# Patient Record
Sex: Female | Born: 1940 | Race: White | Hispanic: No | Smoking: Former smoker
Health system: Southern US, Community
[De-identification: ages and names within clinical notes are randomized; demographics above are authoritative.]

## PROBLEM LIST (undated history)

## (undated) HISTORY — PX: BREAST EXCISIONAL BIOPSY: SUR124

## (undated) HISTORY — PX: ABDOMINAL HYSTERECTOMY: SHX81

---

## 2013-12-03 DIAGNOSIS — L723 Sebaceous cyst: Secondary | ICD-10-CM | POA: Diagnosis not present

## 2013-12-03 DIAGNOSIS — L821 Other seborrheic keratosis: Secondary | ICD-10-CM | POA: Diagnosis not present

## 2013-12-03 DIAGNOSIS — L570 Actinic keratosis: Secondary | ICD-10-CM | POA: Diagnosis not present

## 2013-12-18 DIAGNOSIS — M9981 Other biomechanical lesions of cervical region: Secondary | ICD-10-CM | POA: Diagnosis not present

## 2013-12-18 DIAGNOSIS — M545 Low back pain, unspecified: Secondary | ICD-10-CM | POA: Diagnosis not present

## 2013-12-18 DIAGNOSIS — M999 Biomechanical lesion, unspecified: Secondary | ICD-10-CM | POA: Diagnosis not present

## 2013-12-25 DIAGNOSIS — N951 Menopausal and female climacteric states: Secondary | ICD-10-CM | POA: Diagnosis not present

## 2013-12-25 DIAGNOSIS — N952 Postmenopausal atrophic vaginitis: Secondary | ICD-10-CM | POA: Diagnosis not present

## 2014-01-15 DIAGNOSIS — I10 Essential (primary) hypertension: Secondary | ICD-10-CM | POA: Diagnosis not present

## 2014-01-15 DIAGNOSIS — Z23 Encounter for immunization: Secondary | ICD-10-CM | POA: Diagnosis not present

## 2014-01-15 DIAGNOSIS — M1990 Unspecified osteoarthritis, unspecified site: Secondary | ICD-10-CM | POA: Diagnosis not present

## 2014-01-15 DIAGNOSIS — N952 Postmenopausal atrophic vaginitis: Secondary | ICD-10-CM | POA: Diagnosis not present

## 2014-01-15 DIAGNOSIS — E785 Hyperlipidemia, unspecified: Secondary | ICD-10-CM | POA: Diagnosis not present

## 2014-01-15 DIAGNOSIS — G4700 Insomnia, unspecified: Secondary | ICD-10-CM | POA: Diagnosis not present

## 2014-01-15 DIAGNOSIS — E119 Type 2 diabetes mellitus without complications: Secondary | ICD-10-CM | POA: Diagnosis not present

## 2014-01-15 DIAGNOSIS — IMO0002 Reserved for concepts with insufficient information to code with codable children: Secondary | ICD-10-CM | POA: Diagnosis not present

## 2014-01-23 DIAGNOSIS — M2550 Pain in unspecified joint: Secondary | ICD-10-CM | POA: Diagnosis not present

## 2014-02-02 DIAGNOSIS — J309 Allergic rhinitis, unspecified: Secondary | ICD-10-CM | POA: Diagnosis not present

## 2014-02-02 DIAGNOSIS — R21 Rash and other nonspecific skin eruption: Secondary | ICD-10-CM | POA: Diagnosis not present

## 2014-02-03 DIAGNOSIS — M25473 Effusion, unspecified ankle: Secondary | ICD-10-CM | POA: Diagnosis not present

## 2014-02-03 DIAGNOSIS — M659 Synovitis and tenosynovitis, unspecified: Secondary | ICD-10-CM | POA: Diagnosis not present

## 2014-02-03 DIAGNOSIS — M12579 Traumatic arthropathy, unspecified ankle and foot: Secondary | ICD-10-CM | POA: Diagnosis not present

## 2014-02-03 DIAGNOSIS — M25476 Effusion, unspecified foot: Secondary | ICD-10-CM | POA: Diagnosis not present

## 2014-02-03 DIAGNOSIS — M779 Enthesopathy, unspecified: Secondary | ICD-10-CM | POA: Diagnosis not present

## 2014-02-05 DIAGNOSIS — R21 Rash and other nonspecific skin eruption: Secondary | ICD-10-CM | POA: Diagnosis not present

## 2014-02-12 DIAGNOSIS — H269 Unspecified cataract: Secondary | ICD-10-CM | POA: Diagnosis not present

## 2014-02-12 DIAGNOSIS — E119 Type 2 diabetes mellitus without complications: Secondary | ICD-10-CM | POA: Diagnosis not present

## 2014-03-19 DIAGNOSIS — Z23 Encounter for immunization: Secondary | ICD-10-CM | POA: Diagnosis not present

## 2014-05-09 DIAGNOSIS — L255 Unspecified contact dermatitis due to plants, except food: Secondary | ICD-10-CM | POA: Diagnosis not present

## 2014-05-09 DIAGNOSIS — T622X1A Toxic effect of other ingested (parts of) plant(s), accidental (unintentional), initial encounter: Secondary | ICD-10-CM | POA: Diagnosis not present

## 2014-05-14 DIAGNOSIS — Z85828 Personal history of other malignant neoplasm of skin: Secondary | ICD-10-CM | POA: Diagnosis not present

## 2014-05-14 DIAGNOSIS — L259 Unspecified contact dermatitis, unspecified cause: Secondary | ICD-10-CM | POA: Diagnosis not present

## 2014-05-21 DIAGNOSIS — I10 Essential (primary) hypertension: Secondary | ICD-10-CM | POA: Diagnosis not present

## 2014-05-21 DIAGNOSIS — M2550 Pain in unspecified joint: Secondary | ICD-10-CM | POA: Diagnosis not present

## 2014-05-21 DIAGNOSIS — M1990 Unspecified osteoarthritis, unspecified site: Secondary | ICD-10-CM | POA: Diagnosis not present

## 2014-05-21 DIAGNOSIS — Z0000 Encounter for general adult medical examination without abnormal findings: Secondary | ICD-10-CM | POA: Diagnosis not present

## 2014-05-21 DIAGNOSIS — E785 Hyperlipidemia, unspecified: Secondary | ICD-10-CM | POA: Diagnosis not present

## 2014-05-21 DIAGNOSIS — E119 Type 2 diabetes mellitus without complications: Secondary | ICD-10-CM | POA: Diagnosis not present

## 2014-05-21 DIAGNOSIS — N952 Postmenopausal atrophic vaginitis: Secondary | ICD-10-CM | POA: Diagnosis not present

## 2014-05-28 DIAGNOSIS — M25579 Pain in unspecified ankle and joints of unspecified foot: Secondary | ICD-10-CM | POA: Diagnosis not present

## 2014-06-23 DIAGNOSIS — R7309 Other abnormal glucose: Secondary | ICD-10-CM | POA: Diagnosis not present

## 2014-06-23 DIAGNOSIS — Z23 Encounter for immunization: Secondary | ICD-10-CM | POA: Diagnosis not present

## 2014-06-23 DIAGNOSIS — R5383 Other fatigue: Secondary | ICD-10-CM | POA: Diagnosis not present

## 2014-06-23 DIAGNOSIS — Z79899 Other long term (current) drug therapy: Secondary | ICD-10-CM | POA: Diagnosis not present

## 2014-06-23 DIAGNOSIS — R635 Abnormal weight gain: Secondary | ICD-10-CM | POA: Diagnosis not present

## 2014-06-23 DIAGNOSIS — Z131 Encounter for screening for diabetes mellitus: Secondary | ICD-10-CM | POA: Diagnosis not present

## 2014-06-23 DIAGNOSIS — I10 Essential (primary) hypertension: Secondary | ICD-10-CM | POA: Diagnosis not present

## 2014-06-23 DIAGNOSIS — E782 Mixed hyperlipidemia: Secondary | ICD-10-CM | POA: Diagnosis not present

## 2014-06-23 DIAGNOSIS — R5381 Other malaise: Secondary | ICD-10-CM | POA: Diagnosis not present

## 2014-08-03 DIAGNOSIS — Z1231 Encounter for screening mammogram for malignant neoplasm of breast: Secondary | ICD-10-CM | POA: Diagnosis not present

## 2014-08-05 DIAGNOSIS — D2272 Melanocytic nevi of left lower limb, including hip: Secondary | ICD-10-CM | POA: Diagnosis not present

## 2014-08-05 DIAGNOSIS — D2271 Melanocytic nevi of right lower limb, including hip: Secondary | ICD-10-CM | POA: Diagnosis not present

## 2014-08-05 DIAGNOSIS — D2261 Melanocytic nevi of right upper limb, including shoulder: Secondary | ICD-10-CM | POA: Diagnosis not present

## 2014-08-05 DIAGNOSIS — X32XXXA Exposure to sunlight, initial encounter: Secondary | ICD-10-CM | POA: Diagnosis not present

## 2014-08-05 DIAGNOSIS — L821 Other seborrheic keratosis: Secondary | ICD-10-CM | POA: Diagnosis not present

## 2014-08-05 DIAGNOSIS — L814 Other melanin hyperpigmentation: Secondary | ICD-10-CM | POA: Diagnosis not present

## 2014-08-05 DIAGNOSIS — D225 Melanocytic nevi of trunk: Secondary | ICD-10-CM | POA: Diagnosis not present

## 2014-08-05 DIAGNOSIS — D235 Other benign neoplasm of skin of trunk: Secondary | ICD-10-CM | POA: Diagnosis not present

## 2014-08-05 DIAGNOSIS — L570 Actinic keratosis: Secondary | ICD-10-CM | POA: Diagnosis not present

## 2014-08-05 DIAGNOSIS — D2262 Melanocytic nevi of left upper limb, including shoulder: Secondary | ICD-10-CM | POA: Diagnosis not present

## 2014-08-07 DIAGNOSIS — R928 Other abnormal and inconclusive findings on diagnostic imaging of breast: Secondary | ICD-10-CM | POA: Diagnosis not present

## 2014-09-11 DIAGNOSIS — J069 Acute upper respiratory infection, unspecified: Secondary | ICD-10-CM | POA: Diagnosis not present

## 2014-09-15 DIAGNOSIS — H6121 Impacted cerumen, right ear: Secondary | ICD-10-CM | POA: Diagnosis not present

## 2014-09-22 DIAGNOSIS — R911 Solitary pulmonary nodule: Secondary | ICD-10-CM | POA: Diagnosis not present

## 2014-09-22 DIAGNOSIS — I10 Essential (primary) hypertension: Secondary | ICD-10-CM | POA: Diagnosis not present

## 2014-10-09 DIAGNOSIS — R911 Solitary pulmonary nodule: Secondary | ICD-10-CM | POA: Diagnosis not present

## 2014-12-22 DIAGNOSIS — Z79899 Other long term (current) drug therapy: Secondary | ICD-10-CM | POA: Diagnosis not present

## 2014-12-22 DIAGNOSIS — E782 Mixed hyperlipidemia: Secondary | ICD-10-CM | POA: Diagnosis not present

## 2014-12-22 DIAGNOSIS — I10 Essential (primary) hypertension: Secondary | ICD-10-CM | POA: Diagnosis not present

## 2014-12-22 DIAGNOSIS — E7439 Other disorders of intestinal carbohydrate absorption: Secondary | ICD-10-CM | POA: Diagnosis not present

## 2014-12-23 DIAGNOSIS — E7439 Other disorders of intestinal carbohydrate absorption: Secondary | ICD-10-CM | POA: Diagnosis not present

## 2014-12-23 DIAGNOSIS — I10 Essential (primary) hypertension: Secondary | ICD-10-CM | POA: Diagnosis not present

## 2015-01-25 ENCOUNTER — Other Ambulatory Visit: Payer: Self-pay

## 2015-01-25 DIAGNOSIS — Z1231 Encounter for screening mammogram for malignant neoplasm of breast: Secondary | ICD-10-CM

## 2015-01-26 DIAGNOSIS — I10 Essential (primary) hypertension: Secondary | ICD-10-CM | POA: Diagnosis not present

## 2015-01-26 DIAGNOSIS — E780 Pure hypercholesterolemia: Secondary | ICD-10-CM | POA: Diagnosis not present

## 2015-01-26 DIAGNOSIS — R7309 Other abnormal glucose: Secondary | ICD-10-CM | POA: Diagnosis not present

## 2015-03-12 DIAGNOSIS — N898 Other specified noninflammatory disorders of vagina: Secondary | ICD-10-CM | POA: Diagnosis not present

## 2015-03-12 DIAGNOSIS — I10 Essential (primary) hypertension: Secondary | ICD-10-CM | POA: Diagnosis not present

## 2015-03-15 DIAGNOSIS — H5000 Unspecified esotropia: Secondary | ICD-10-CM | POA: Diagnosis not present

## 2015-03-15 DIAGNOSIS — H2513 Age-related nuclear cataract, bilateral: Secondary | ICD-10-CM | POA: Diagnosis not present

## 2015-03-15 DIAGNOSIS — H52203 Unspecified astigmatism, bilateral: Secondary | ICD-10-CM | POA: Diagnosis not present

## 2015-05-03 DIAGNOSIS — I10 Essential (primary) hypertension: Secondary | ICD-10-CM | POA: Diagnosis not present

## 2015-05-30 DIAGNOSIS — L02212 Cutaneous abscess of back [any part, except buttock]: Secondary | ICD-10-CM | POA: Diagnosis not present

## 2015-06-04 DIAGNOSIS — L723 Sebaceous cyst: Secondary | ICD-10-CM | POA: Diagnosis not present

## 2015-06-30 DIAGNOSIS — Z23 Encounter for immunization: Secondary | ICD-10-CM | POA: Diagnosis not present

## 2015-08-05 DIAGNOSIS — L570 Actinic keratosis: Secondary | ICD-10-CM | POA: Diagnosis not present

## 2015-08-05 DIAGNOSIS — L821 Other seborrheic keratosis: Secondary | ICD-10-CM | POA: Diagnosis not present

## 2015-08-05 DIAGNOSIS — D485 Neoplasm of uncertain behavior of skin: Secondary | ICD-10-CM | POA: Diagnosis not present

## 2015-08-05 DIAGNOSIS — D224 Melanocytic nevi of scalp and neck: Secondary | ICD-10-CM | POA: Diagnosis not present

## 2015-08-05 DIAGNOSIS — D1801 Hemangioma of skin and subcutaneous tissue: Secondary | ICD-10-CM | POA: Diagnosis not present

## 2015-08-05 DIAGNOSIS — L814 Other melanin hyperpigmentation: Secondary | ICD-10-CM | POA: Diagnosis not present

## 2015-08-05 DIAGNOSIS — L905 Scar conditions and fibrosis of skin: Secondary | ICD-10-CM | POA: Diagnosis not present

## 2015-08-05 DIAGNOSIS — D225 Melanocytic nevi of trunk: Secondary | ICD-10-CM | POA: Diagnosis not present

## 2015-08-16 ENCOUNTER — Ambulatory Visit
Admission: RE | Admit: 2015-08-16 | Discharge: 2015-08-16 | Disposition: A | Discharge status: Home / Self Care | Payer: Medicare Other | Source: Ambulatory Visit

## 2015-08-16 DIAGNOSIS — Z1231 Encounter for screening mammogram for malignant neoplasm of breast: Secondary | ICD-10-CM | POA: Diagnosis not present

## 2015-09-29 ENCOUNTER — Other Ambulatory Visit: Payer: Self-pay

## 2015-09-29 DIAGNOSIS — Z1231 Encounter for screening mammogram for malignant neoplasm of breast: Secondary | ICD-10-CM

## 2015-11-12 DIAGNOSIS — E7800 Pure hypercholesterolemia, unspecified: Secondary | ICD-10-CM | POA: Diagnosis not present

## 2015-11-12 DIAGNOSIS — Z780 Asymptomatic menopausal state: Secondary | ICD-10-CM | POA: Diagnosis not present

## 2015-11-12 DIAGNOSIS — Z0000 Encounter for general adult medical examination without abnormal findings: Secondary | ICD-10-CM | POA: Diagnosis not present

## 2015-11-12 DIAGNOSIS — Z1389 Encounter for screening for other disorder: Secondary | ICD-10-CM | POA: Diagnosis not present

## 2015-11-12 DIAGNOSIS — Z79899 Other long term (current) drug therapy: Secondary | ICD-10-CM | POA: Diagnosis not present

## 2015-11-12 DIAGNOSIS — R739 Hyperglycemia, unspecified: Secondary | ICD-10-CM | POA: Diagnosis not present

## 2015-11-12 DIAGNOSIS — E669 Obesity, unspecified: Secondary | ICD-10-CM | POA: Diagnosis not present

## 2015-11-12 DIAGNOSIS — Z6830 Body mass index (BMI) 30.0-30.9, adult: Secondary | ICD-10-CM | POA: Diagnosis not present

## 2015-11-12 DIAGNOSIS — I10 Essential (primary) hypertension: Secondary | ICD-10-CM | POA: Diagnosis not present

## 2016-02-02 DIAGNOSIS — M7130 Other bursal cyst, unspecified site: Secondary | ICD-10-CM | POA: Diagnosis not present

## 2016-02-02 DIAGNOSIS — I10 Essential (primary) hypertension: Secondary | ICD-10-CM | POA: Diagnosis not present

## 2016-02-08 DIAGNOSIS — M8589 Other specified disorders of bone density and structure, multiple sites: Secondary | ICD-10-CM | POA: Diagnosis not present

## 2016-02-08 DIAGNOSIS — Z780 Asymptomatic menopausal state: Secondary | ICD-10-CM | POA: Diagnosis not present

## 2016-03-01 ENCOUNTER — Ambulatory Visit
Admission: RE | Admit: 2016-03-01 | Discharge: 2016-03-01 | Disposition: A | Discharge status: Home / Self Care | Payer: Medicare Other | Source: Ambulatory Visit | Attending: Geriatric Medicine | Admitting: Geriatric Medicine

## 2016-03-01 ENCOUNTER — Other Ambulatory Visit: Payer: Self-pay | Admitting: Geriatric Medicine

## 2016-03-01 DIAGNOSIS — M47812 Spondylosis without myelopathy or radiculopathy, cervical region: Secondary | ICD-10-CM | POA: Diagnosis not present

## 2016-03-01 DIAGNOSIS — M542 Cervicalgia: Secondary | ICD-10-CM

## 2016-03-15 DIAGNOSIS — H2513 Age-related nuclear cataract, bilateral: Secondary | ICD-10-CM | POA: Diagnosis not present

## 2016-03-15 DIAGNOSIS — H52203 Unspecified astigmatism, bilateral: Secondary | ICD-10-CM | POA: Diagnosis not present

## 2016-03-15 DIAGNOSIS — H5000 Unspecified esotropia: Secondary | ICD-10-CM | POA: Diagnosis not present

## 2016-03-30 DIAGNOSIS — H2511 Age-related nuclear cataract, right eye: Secondary | ICD-10-CM | POA: Diagnosis not present

## 2016-03-30 DIAGNOSIS — H25811 Combined forms of age-related cataract, right eye: Secondary | ICD-10-CM | POA: Diagnosis not present

## 2016-04-24 DIAGNOSIS — A499 Bacterial infection, unspecified: Secondary | ICD-10-CM | POA: Diagnosis not present

## 2016-04-24 DIAGNOSIS — N390 Urinary tract infection, site not specified: Secondary | ICD-10-CM | POA: Diagnosis not present

## 2016-04-24 DIAGNOSIS — L989 Disorder of the skin and subcutaneous tissue, unspecified: Secondary | ICD-10-CM | POA: Diagnosis not present

## 2016-05-04 DIAGNOSIS — A499 Bacterial infection, unspecified: Secondary | ICD-10-CM | POA: Diagnosis not present

## 2016-05-04 DIAGNOSIS — N390 Urinary tract infection, site not specified: Secondary | ICD-10-CM | POA: Diagnosis not present

## 2016-05-11 DIAGNOSIS — I10 Essential (primary) hypertension: Secondary | ICD-10-CM | POA: Diagnosis not present

## 2016-05-18 DIAGNOSIS — H25811 Combined forms of age-related cataract, right eye: Secondary | ICD-10-CM | POA: Diagnosis not present

## 2016-05-18 DIAGNOSIS — H2512 Age-related nuclear cataract, left eye: Secondary | ICD-10-CM | POA: Diagnosis not present

## 2016-05-18 DIAGNOSIS — H2511 Age-related nuclear cataract, right eye: Secondary | ICD-10-CM | POA: Diagnosis not present

## 2016-05-22 DIAGNOSIS — L820 Inflamed seborrheic keratosis: Secondary | ICD-10-CM | POA: Diagnosis not present

## 2016-05-24 DIAGNOSIS — N898 Other specified noninflammatory disorders of vagina: Secondary | ICD-10-CM | POA: Diagnosis not present

## 2016-05-24 DIAGNOSIS — N941 Unspecified dyspareunia: Secondary | ICD-10-CM | POA: Diagnosis not present

## 2016-05-24 DIAGNOSIS — Z90710 Acquired absence of both cervix and uterus: Secondary | ICD-10-CM | POA: Diagnosis not present

## 2016-05-24 DIAGNOSIS — N952 Postmenopausal atrophic vaginitis: Secondary | ICD-10-CM | POA: Diagnosis not present

## 2016-06-26 DIAGNOSIS — Z23 Encounter for immunization: Secondary | ICD-10-CM | POA: Diagnosis not present

## 2016-07-20 DIAGNOSIS — N941 Unspecified dyspareunia: Secondary | ICD-10-CM | POA: Diagnosis not present

## 2016-07-20 DIAGNOSIS — Z90710 Acquired absence of both cervix and uterus: Secondary | ICD-10-CM | POA: Diagnosis not present

## 2016-07-20 DIAGNOSIS — N952 Postmenopausal atrophic vaginitis: Secondary | ICD-10-CM | POA: Diagnosis not present

## 2016-08-21 ENCOUNTER — Ambulatory Visit
Admission: RE | Admit: 2016-08-21 | Discharge: 2016-08-21 | Disposition: A | Discharge status: Home / Self Care | Payer: Medicare Other | Source: Ambulatory Visit

## 2016-08-21 DIAGNOSIS — Z1231 Encounter for screening mammogram for malignant neoplasm of breast: Secondary | ICD-10-CM | POA: Diagnosis not present

## 2016-08-22 ENCOUNTER — Other Ambulatory Visit: Payer: Self-pay | Admitting: Geriatric Medicine

## 2016-08-22 DIAGNOSIS — R928 Other abnormal and inconclusive findings on diagnostic imaging of breast: Secondary | ICD-10-CM

## 2016-08-23 ENCOUNTER — Ambulatory Visit
Admission: RE | Admit: 2016-08-23 | Discharge: 2016-08-23 | Disposition: A | Discharge status: Home / Self Care | Payer: Medicare Other | Source: Ambulatory Visit | Attending: Geriatric Medicine | Admitting: Geriatric Medicine

## 2016-08-23 DIAGNOSIS — D485 Neoplasm of uncertain behavior of skin: Secondary | ICD-10-CM | POA: Diagnosis not present

## 2016-08-23 DIAGNOSIS — L821 Other seborrheic keratosis: Secondary | ICD-10-CM | POA: Diagnosis not present

## 2016-08-23 DIAGNOSIS — N6489 Other specified disorders of breast: Secondary | ICD-10-CM | POA: Diagnosis not present

## 2016-08-23 DIAGNOSIS — L814 Other melanin hyperpigmentation: Secondary | ICD-10-CM | POA: Diagnosis not present

## 2016-08-23 DIAGNOSIS — D2372 Other benign neoplasm of skin of left lower limb, including hip: Secondary | ICD-10-CM | POA: Diagnosis not present

## 2016-08-23 DIAGNOSIS — R928 Other abnormal and inconclusive findings on diagnostic imaging of breast: Secondary | ICD-10-CM

## 2016-08-23 DIAGNOSIS — R922 Inconclusive mammogram: Secondary | ICD-10-CM | POA: Diagnosis not present

## 2016-08-24 ENCOUNTER — Other Ambulatory Visit: Payer: Self-pay | Admitting: Geriatric Medicine

## 2016-08-24 DIAGNOSIS — N6489 Other specified disorders of breast: Secondary | ICD-10-CM

## 2016-09-14 DIAGNOSIS — I10 Essential (primary) hypertension: Secondary | ICD-10-CM | POA: Diagnosis not present

## 2016-10-31 DIAGNOSIS — N952 Postmenopausal atrophic vaginitis: Secondary | ICD-10-CM | POA: Diagnosis not present

## 2016-11-24 DIAGNOSIS — Z7189 Other specified counseling: Secondary | ICD-10-CM | POA: Diagnosis not present

## 2016-11-24 DIAGNOSIS — Z0000 Encounter for general adult medical examination without abnormal findings: Secondary | ICD-10-CM | POA: Diagnosis not present

## 2016-11-24 DIAGNOSIS — I10 Essential (primary) hypertension: Secondary | ICD-10-CM | POA: Diagnosis not present

## 2016-11-24 DIAGNOSIS — E7800 Pure hypercholesterolemia, unspecified: Secondary | ICD-10-CM | POA: Diagnosis not present

## 2016-11-24 DIAGNOSIS — Z1389 Encounter for screening for other disorder: Secondary | ICD-10-CM | POA: Diagnosis not present

## 2016-11-24 DIAGNOSIS — Z79899 Other long term (current) drug therapy: Secondary | ICD-10-CM | POA: Diagnosis not present

## 2016-12-27 DIAGNOSIS — E7800 Pure hypercholesterolemia, unspecified: Secondary | ICD-10-CM | POA: Diagnosis not present

## 2016-12-27 DIAGNOSIS — Z79899 Other long term (current) drug therapy: Secondary | ICD-10-CM | POA: Diagnosis not present

## 2016-12-27 DIAGNOSIS — I10 Essential (primary) hypertension: Secondary | ICD-10-CM | POA: Diagnosis not present

## 2017-01-28 DIAGNOSIS — R030 Elevated blood-pressure reading, without diagnosis of hypertension: Secondary | ICD-10-CM | POA: Diagnosis not present

## 2017-01-29 DIAGNOSIS — K219 Gastro-esophageal reflux disease without esophagitis: Secondary | ICD-10-CM | POA: Diagnosis not present

## 2017-01-29 DIAGNOSIS — I10 Essential (primary) hypertension: Secondary | ICD-10-CM | POA: Diagnosis not present

## 2017-02-05 DIAGNOSIS — R300 Dysuria: Secondary | ICD-10-CM | POA: Diagnosis not present

## 2017-02-05 DIAGNOSIS — L298 Other pruritus: Secondary | ICD-10-CM | POA: Diagnosis not present

## 2017-02-05 DIAGNOSIS — N952 Postmenopausal atrophic vaginitis: Secondary | ICD-10-CM | POA: Diagnosis not present

## 2017-02-27 ENCOUNTER — Other Ambulatory Visit: Payer: Medicare Other

## 2017-03-01 ENCOUNTER — Ambulatory Visit
Admission: RE | Admit: 2017-03-01 | Discharge: 2017-03-01 | Disposition: A | Discharge status: Home / Self Care | Payer: Medicare Other | Source: Ambulatory Visit | Attending: Geriatric Medicine | Admitting: Geriatric Medicine

## 2017-03-01 ENCOUNTER — Other Ambulatory Visit: Payer: Self-pay | Admitting: Geriatric Medicine

## 2017-03-01 DIAGNOSIS — R928 Other abnormal and inconclusive findings on diagnostic imaging of breast: Secondary | ICD-10-CM | POA: Diagnosis not present

## 2017-03-01 DIAGNOSIS — N6489 Other specified disorders of breast: Secondary | ICD-10-CM

## 2017-03-01 DIAGNOSIS — Z1231 Encounter for screening mammogram for malignant neoplasm of breast: Secondary | ICD-10-CM

## 2017-03-12 DIAGNOSIS — I872 Venous insufficiency (chronic) (peripheral): Secondary | ICD-10-CM | POA: Diagnosis not present

## 2017-03-12 DIAGNOSIS — Z79899 Other long term (current) drug therapy: Secondary | ICD-10-CM | POA: Diagnosis not present

## 2017-03-12 DIAGNOSIS — I10 Essential (primary) hypertension: Secondary | ICD-10-CM | POA: Diagnosis not present

## 2017-05-30 DIAGNOSIS — I10 Essential (primary) hypertension: Secondary | ICD-10-CM | POA: Diagnosis not present

## 2017-06-26 DIAGNOSIS — Z961 Presence of intraocular lens: Secondary | ICD-10-CM | POA: Diagnosis not present

## 2017-06-26 DIAGNOSIS — H5000 Unspecified esotropia: Secondary | ICD-10-CM | POA: Diagnosis not present

## 2017-06-26 DIAGNOSIS — H52203 Unspecified astigmatism, bilateral: Secondary | ICD-10-CM | POA: Diagnosis not present

## 2017-06-29 DIAGNOSIS — H43812 Vitreous degeneration, left eye: Secondary | ICD-10-CM | POA: Diagnosis not present

## 2017-07-02 DIAGNOSIS — Z23 Encounter for immunization: Secondary | ICD-10-CM | POA: Diagnosis not present

## 2017-08-01 DIAGNOSIS — N941 Unspecified dyspareunia: Secondary | ICD-10-CM | POA: Diagnosis not present

## 2017-08-01 DIAGNOSIS — N952 Postmenopausal atrophic vaginitis: Secondary | ICD-10-CM | POA: Diagnosis not present

## 2017-08-01 DIAGNOSIS — Z01419 Encounter for gynecological examination (general) (routine) without abnormal findings: Secondary | ICD-10-CM | POA: Diagnosis not present

## 2017-08-02 ENCOUNTER — Ambulatory Visit: Payer: Medicare Other

## 2017-08-03 ENCOUNTER — Ambulatory Visit: Payer: Medicare Other

## 2017-08-03 DIAGNOSIS — H43812 Vitreous degeneration, left eye: Secondary | ICD-10-CM | POA: Diagnosis not present

## 2017-08-23 ENCOUNTER — Ambulatory Visit
Admission: RE | Admit: 2017-08-23 | Discharge: 2017-08-23 | Disposition: A | Discharge status: Home / Self Care | Payer: Medicare Other | Source: Ambulatory Visit | Attending: Geriatric Medicine | Admitting: Geriatric Medicine

## 2017-08-23 DIAGNOSIS — M6740 Ganglion, unspecified site: Secondary | ICD-10-CM | POA: Diagnosis not present

## 2017-08-23 DIAGNOSIS — L438 Other lichen planus: Secondary | ICD-10-CM | POA: Diagnosis not present

## 2017-08-23 DIAGNOSIS — D1801 Hemangioma of skin and subcutaneous tissue: Secondary | ICD-10-CM | POA: Diagnosis not present

## 2017-08-23 DIAGNOSIS — L603 Nail dystrophy: Secondary | ICD-10-CM | POA: Diagnosis not present

## 2017-08-23 DIAGNOSIS — L570 Actinic keratosis: Secondary | ICD-10-CM | POA: Diagnosis not present

## 2017-08-23 DIAGNOSIS — L821 Other seborrheic keratosis: Secondary | ICD-10-CM | POA: Diagnosis not present

## 2017-08-23 DIAGNOSIS — L814 Other melanin hyperpigmentation: Secondary | ICD-10-CM | POA: Diagnosis not present

## 2017-08-23 DIAGNOSIS — Z1231 Encounter for screening mammogram for malignant neoplasm of breast: Secondary | ICD-10-CM | POA: Diagnosis not present

## 2017-08-30 DIAGNOSIS — N390 Urinary tract infection, site not specified: Secondary | ICD-10-CM | POA: Diagnosis not present

## 2017-08-30 DIAGNOSIS — N952 Postmenopausal atrophic vaginitis: Secondary | ICD-10-CM | POA: Diagnosis not present

## 2017-09-05 ENCOUNTER — Ambulatory Visit: Payer: Medicare Other

## 2017-11-29 DIAGNOSIS — Z1389 Encounter for screening for other disorder: Secondary | ICD-10-CM | POA: Diagnosis not present

## 2017-11-29 DIAGNOSIS — M7062 Trochanteric bursitis, left hip: Secondary | ICD-10-CM | POA: Diagnosis not present

## 2017-11-29 DIAGNOSIS — Z79899 Other long term (current) drug therapy: Secondary | ICD-10-CM | POA: Diagnosis not present

## 2017-11-29 DIAGNOSIS — I10 Essential (primary) hypertension: Secondary | ICD-10-CM | POA: Diagnosis not present

## 2017-11-29 DIAGNOSIS — Z0000 Encounter for general adult medical examination without abnormal findings: Secondary | ICD-10-CM | POA: Diagnosis not present

## 2017-12-26 DIAGNOSIS — D485 Neoplasm of uncertain behavior of skin: Secondary | ICD-10-CM | POA: Diagnosis not present

## 2017-12-26 DIAGNOSIS — B079 Viral wart, unspecified: Secondary | ICD-10-CM | POA: Diagnosis not present

## 2017-12-30 DIAGNOSIS — J309 Allergic rhinitis, unspecified: Secondary | ICD-10-CM | POA: Diagnosis not present

## 2018-04-22 DIAGNOSIS — J069 Acute upper respiratory infection, unspecified: Secondary | ICD-10-CM | POA: Diagnosis not present

## 2018-06-13 DIAGNOSIS — Z23 Encounter for immunization: Secondary | ICD-10-CM | POA: Diagnosis not present

## 2018-06-28 DIAGNOSIS — Z961 Presence of intraocular lens: Secondary | ICD-10-CM | POA: Diagnosis not present

## 2018-06-28 DIAGNOSIS — H52203 Unspecified astigmatism, bilateral: Secondary | ICD-10-CM | POA: Diagnosis not present

## 2018-06-28 DIAGNOSIS — H5000 Unspecified esotropia: Secondary | ICD-10-CM | POA: Diagnosis not present

## 2018-08-07 DIAGNOSIS — M50322 Other cervical disc degeneration at C5-C6 level: Secondary | ICD-10-CM | POA: Diagnosis not present

## 2018-08-07 DIAGNOSIS — M9901 Segmental and somatic dysfunction of cervical region: Secondary | ICD-10-CM | POA: Diagnosis not present

## 2018-08-07 DIAGNOSIS — M9902 Segmental and somatic dysfunction of thoracic region: Secondary | ICD-10-CM | POA: Diagnosis not present

## 2018-08-07 DIAGNOSIS — M5134 Other intervertebral disc degeneration, thoracic region: Secondary | ICD-10-CM | POA: Diagnosis not present

## 2018-08-12 DIAGNOSIS — M9902 Segmental and somatic dysfunction of thoracic region: Secondary | ICD-10-CM | POA: Diagnosis not present

## 2018-08-12 DIAGNOSIS — M50322 Other cervical disc degeneration at C5-C6 level: Secondary | ICD-10-CM | POA: Diagnosis not present

## 2018-08-12 DIAGNOSIS — M5134 Other intervertebral disc degeneration, thoracic region: Secondary | ICD-10-CM | POA: Diagnosis not present

## 2018-08-12 DIAGNOSIS — M9901 Segmental and somatic dysfunction of cervical region: Secondary | ICD-10-CM | POA: Diagnosis not present

## 2018-08-14 ENCOUNTER — Other Ambulatory Visit: Payer: Self-pay | Admitting: Geriatric Medicine

## 2018-08-14 DIAGNOSIS — Z1231 Encounter for screening mammogram for malignant neoplasm of breast: Secondary | ICD-10-CM

## 2018-08-15 DIAGNOSIS — M50322 Other cervical disc degeneration at C5-C6 level: Secondary | ICD-10-CM | POA: Diagnosis not present

## 2018-08-15 DIAGNOSIS — M9901 Segmental and somatic dysfunction of cervical region: Secondary | ICD-10-CM | POA: Diagnosis not present

## 2018-08-15 DIAGNOSIS — M5134 Other intervertebral disc degeneration, thoracic region: Secondary | ICD-10-CM | POA: Diagnosis not present

## 2018-08-15 DIAGNOSIS — M9902 Segmental and somatic dysfunction of thoracic region: Secondary | ICD-10-CM | POA: Diagnosis not present

## 2018-08-19 DIAGNOSIS — M50322 Other cervical disc degeneration at C5-C6 level: Secondary | ICD-10-CM | POA: Diagnosis not present

## 2018-08-19 DIAGNOSIS — M5134 Other intervertebral disc degeneration, thoracic region: Secondary | ICD-10-CM | POA: Diagnosis not present

## 2018-08-19 DIAGNOSIS — M9902 Segmental and somatic dysfunction of thoracic region: Secondary | ICD-10-CM | POA: Diagnosis not present

## 2018-08-19 DIAGNOSIS — M9901 Segmental and somatic dysfunction of cervical region: Secondary | ICD-10-CM | POA: Diagnosis not present

## 2018-08-27 DIAGNOSIS — D485 Neoplasm of uncertain behavior of skin: Secondary | ICD-10-CM | POA: Diagnosis not present

## 2018-08-27 DIAGNOSIS — L570 Actinic keratosis: Secondary | ICD-10-CM | POA: Diagnosis not present

## 2018-08-27 DIAGNOSIS — L720 Epidermal cyst: Secondary | ICD-10-CM | POA: Diagnosis not present

## 2018-08-27 DIAGNOSIS — L820 Inflamed seborrheic keratosis: Secondary | ICD-10-CM | POA: Diagnosis not present

## 2018-08-27 DIAGNOSIS — L814 Other melanin hyperpigmentation: Secondary | ICD-10-CM | POA: Diagnosis not present

## 2018-08-27 DIAGNOSIS — D225 Melanocytic nevi of trunk: Secondary | ICD-10-CM | POA: Diagnosis not present

## 2018-08-27 DIAGNOSIS — L821 Other seborrheic keratosis: Secondary | ICD-10-CM | POA: Diagnosis not present

## 2018-09-23 ENCOUNTER — Ambulatory Visit
Admission: RE | Admit: 2018-09-23 | Discharge: 2018-09-23 | Disposition: A | Discharge status: Home / Self Care | Payer: Medicare Other | Source: Ambulatory Visit | Attending: Geriatric Medicine | Admitting: Geriatric Medicine

## 2018-09-23 DIAGNOSIS — Z1231 Encounter for screening mammogram for malignant neoplasm of breast: Secondary | ICD-10-CM | POA: Diagnosis not present

## 2018-10-01 DIAGNOSIS — N952 Postmenopausal atrophic vaginitis: Secondary | ICD-10-CM | POA: Diagnosis not present

## 2018-10-01 DIAGNOSIS — N941 Unspecified dyspareunia: Secondary | ICD-10-CM | POA: Diagnosis not present

## 2018-11-04 DIAGNOSIS — J069 Acute upper respiratory infection, unspecified: Secondary | ICD-10-CM | POA: Diagnosis not present

## 2019-02-10 DIAGNOSIS — L570 Actinic keratosis: Secondary | ICD-10-CM | POA: Diagnosis not present

## 2019-02-10 DIAGNOSIS — L820 Inflamed seborrheic keratosis: Secondary | ICD-10-CM | POA: Diagnosis not present

## 2019-03-07 DIAGNOSIS — K219 Gastro-esophageal reflux disease without esophagitis: Secondary | ICD-10-CM | POA: Diagnosis not present

## 2019-03-07 DIAGNOSIS — Z0000 Encounter for general adult medical examination without abnormal findings: Secondary | ICD-10-CM | POA: Diagnosis not present

## 2019-03-07 DIAGNOSIS — R7303 Prediabetes: Secondary | ICD-10-CM | POA: Diagnosis not present

## 2019-03-07 DIAGNOSIS — Z79899 Other long term (current) drug therapy: Secondary | ICD-10-CM | POA: Diagnosis not present

## 2019-03-07 DIAGNOSIS — F419 Anxiety disorder, unspecified: Secondary | ICD-10-CM | POA: Diagnosis not present

## 2019-03-07 DIAGNOSIS — E7800 Pure hypercholesterolemia, unspecified: Secondary | ICD-10-CM | POA: Diagnosis not present

## 2019-03-07 DIAGNOSIS — I10 Essential (primary) hypertension: Secondary | ICD-10-CM | POA: Diagnosis not present

## 2019-03-07 DIAGNOSIS — Z1389 Encounter for screening for other disorder: Secondary | ICD-10-CM | POA: Diagnosis not present

## 2019-03-14 DIAGNOSIS — R7303 Prediabetes: Secondary | ICD-10-CM | POA: Diagnosis not present

## 2019-03-14 DIAGNOSIS — Z79899 Other long term (current) drug therapy: Secondary | ICD-10-CM | POA: Diagnosis not present

## 2019-03-14 DIAGNOSIS — I10 Essential (primary) hypertension: Secondary | ICD-10-CM | POA: Diagnosis not present

## 2019-03-14 DIAGNOSIS — R300 Dysuria: Secondary | ICD-10-CM | POA: Diagnosis not present

## 2019-03-14 DIAGNOSIS — R7309 Other abnormal glucose: Secondary | ICD-10-CM | POA: Diagnosis not present

## 2019-03-14 DIAGNOSIS — E7800 Pure hypercholesterolemia, unspecified: Secondary | ICD-10-CM | POA: Diagnosis not present

## 2019-03-25 DIAGNOSIS — E7800 Pure hypercholesterolemia, unspecified: Secondary | ICD-10-CM | POA: Diagnosis not present

## 2019-03-25 DIAGNOSIS — I10 Essential (primary) hypertension: Secondary | ICD-10-CM | POA: Diagnosis not present

## 2019-04-18 DIAGNOSIS — E7800 Pure hypercholesterolemia, unspecified: Secondary | ICD-10-CM | POA: Diagnosis not present

## 2019-04-18 DIAGNOSIS — I10 Essential (primary) hypertension: Secondary | ICD-10-CM | POA: Diagnosis not present

## 2019-06-04 DIAGNOSIS — Z23 Encounter for immunization: Secondary | ICD-10-CM | POA: Diagnosis not present

## 2019-06-24 DIAGNOSIS — C44622 Squamous cell carcinoma of skin of right upper limb, including shoulder: Secondary | ICD-10-CM | POA: Diagnosis not present

## 2019-06-24 DIAGNOSIS — Z85828 Personal history of other malignant neoplasm of skin: Secondary | ICD-10-CM | POA: Diagnosis not present

## 2019-07-02 DIAGNOSIS — H02834 Dermatochalasis of left upper eyelid: Secondary | ICD-10-CM | POA: Diagnosis not present

## 2019-07-02 DIAGNOSIS — Z961 Presence of intraocular lens: Secondary | ICD-10-CM | POA: Diagnosis not present

## 2019-07-02 DIAGNOSIS — H52203 Unspecified astigmatism, bilateral: Secondary | ICD-10-CM | POA: Diagnosis not present

## 2019-07-02 DIAGNOSIS — H02831 Dermatochalasis of right upper eyelid: Secondary | ICD-10-CM | POA: Diagnosis not present

## 2019-07-16 ENCOUNTER — Other Ambulatory Visit: Payer: Self-pay | Admitting: Geriatric Medicine

## 2019-07-16 DIAGNOSIS — Z1231 Encounter for screening mammogram for malignant neoplasm of breast: Secondary | ICD-10-CM

## 2019-09-03 DIAGNOSIS — L821 Other seborrheic keratosis: Secondary | ICD-10-CM | POA: Diagnosis not present

## 2019-09-03 DIAGNOSIS — L570 Actinic keratosis: Secondary | ICD-10-CM | POA: Diagnosis not present

## 2019-09-03 DIAGNOSIS — Z85828 Personal history of other malignant neoplasm of skin: Secondary | ICD-10-CM | POA: Diagnosis not present

## 2019-09-03 DIAGNOSIS — D485 Neoplasm of uncertain behavior of skin: Secondary | ICD-10-CM | POA: Diagnosis not present

## 2019-09-03 DIAGNOSIS — L814 Other melanin hyperpigmentation: Secondary | ICD-10-CM | POA: Diagnosis not present

## 2019-09-25 ENCOUNTER — Ambulatory Visit
Admission: RE | Admit: 2019-09-25 | Discharge: 2019-09-25 | Disposition: A | Discharge status: Home / Self Care | Payer: Medicare Other | Source: Ambulatory Visit | Attending: Geriatric Medicine | Admitting: Geriatric Medicine

## 2019-09-25 ENCOUNTER — Other Ambulatory Visit: Payer: Self-pay

## 2019-09-25 DIAGNOSIS — Z1231 Encounter for screening mammogram for malignant neoplasm of breast: Secondary | ICD-10-CM

## 2019-10-06 ENCOUNTER — Ambulatory Visit: Payer: Medicare Other | Attending: Internal Medicine

## 2019-10-06 DIAGNOSIS — Z23 Encounter for immunization: Secondary | ICD-10-CM

## 2019-10-06 NOTE — Progress Notes (Signed)
   Covid-19 Vaccination Clinic  Name:  Brooke Osborne    MRN: [id number] DOB: [birth year]  10/06/2019  [name] was observed post Covid-19 immunization for 15 minutes without incidence. She was provided with Vaccine Information Sheet and instruction to access the V-Safe system.   [name] was instructed to call 911 with any severe reactions post vaccine: Marland Kitchen Difficulty breathing  . Swelling of your face and throat  . A fast heartbeat  . A bad rash all over your body  . Dizziness and weakness

## 2019-10-26 ENCOUNTER — Ambulatory Visit: Payer: Medicare Other

## 2019-10-28 ENCOUNTER — Ambulatory Visit: Payer: PRIVATE HEALTH INSURANCE | Attending: Internal Medicine

## 2019-10-28 DIAGNOSIS — Z23 Encounter for immunization: Secondary | ICD-10-CM | POA: Insufficient documentation

## 2019-10-28 NOTE — Progress Notes (Signed)
   Covid-19 Vaccination Clinic  Name:  Brooke Osborne    MRN: [id number] DOB: 1941/06/24  10/28/2019  [name] was observed post Covid-19 immunization for 15 minutes without incidence. She was provided with Vaccine Information Sheet and instruction to access the V-Safe system.   [name] was instructed to call 911 with any severe reactions post vaccine: Marland Kitchen Difficulty breathing  . Swelling of your face and throat  . A fast heartbeat  . A bad rash all over your body  . Dizziness and weakness    Immunizations Administered    Name Date Dose VIS Date Route   Pfizer COVID-19 Vaccine 10/28/2019  9:10 AM 0.3 mL 09/05/2019 Intramuscular   Manufacturer: [place]   Lot: [id number]   [place]: [id number]

## 2020-03-11 ENCOUNTER — Other Ambulatory Visit: Payer: Self-pay | Admitting: Geriatric Medicine

## 2020-03-11 DIAGNOSIS — M8580 Other specified disorders of bone density and structure, unspecified site: Secondary | ICD-10-CM

## 2020-04-16 ENCOUNTER — Other Ambulatory Visit: Payer: Self-pay | Admitting: Geriatric Medicine

## 2020-04-16 DIAGNOSIS — Z1231 Encounter for screening mammogram for malignant neoplasm of breast: Secondary | ICD-10-CM

## 2020-05-28 ENCOUNTER — Other Ambulatory Visit: Payer: Self-pay | Admitting: Geriatric Medicine

## 2020-05-28 DIAGNOSIS — M85852 Other specified disorders of bone density and structure, left thigh: Secondary | ICD-10-CM

## 2020-06-02 ENCOUNTER — Ambulatory Visit
Admission: RE | Admit: 2020-06-02 | Discharge: 2020-06-02 | Disposition: A | Discharge status: Home / Self Care | Payer: Medicare Other | Source: Ambulatory Visit | Attending: Geriatric Medicine | Admitting: Geriatric Medicine

## 2020-06-02 ENCOUNTER — Other Ambulatory Visit: Payer: Self-pay

## 2020-06-02 DIAGNOSIS — M85852 Other specified disorders of bone density and structure, left thigh: Secondary | ICD-10-CM

## 2020-09-21 ENCOUNTER — Ambulatory Visit: Payer: Medicare Other | Admitting: Podiatry

## 2020-09-27 ENCOUNTER — Ambulatory Visit: Payer: PRIVATE HEALTH INSURANCE

## 2020-09-28 ENCOUNTER — Other Ambulatory Visit: Payer: Self-pay

## 2020-09-28 ENCOUNTER — Ambulatory Visit
Admission: RE | Admit: 2020-09-28 | Discharge: 2020-09-28 | Disposition: A | Discharge status: Home / Self Care | Payer: Medicare Other | Source: Ambulatory Visit | Attending: Geriatric Medicine | Admitting: Geriatric Medicine

## 2020-09-28 DIAGNOSIS — Z1231 Encounter for screening mammogram for malignant neoplasm of breast: Secondary | ICD-10-CM | POA: Diagnosis not present

## 2020-10-01 DIAGNOSIS — I10 Essential (primary) hypertension: Secondary | ICD-10-CM | POA: Diagnosis not present

## 2020-10-04 DIAGNOSIS — I10 Essential (primary) hypertension: Secondary | ICD-10-CM | POA: Diagnosis not present

## 2020-10-22 DIAGNOSIS — I10 Essential (primary) hypertension: Secondary | ICD-10-CM | POA: Diagnosis not present

## 2020-10-22 DIAGNOSIS — K219 Gastro-esophageal reflux disease without esophagitis: Secondary | ICD-10-CM | POA: Diagnosis not present

## 2020-10-22 DIAGNOSIS — E7800 Pure hypercholesterolemia, unspecified: Secondary | ICD-10-CM | POA: Diagnosis not present

## 2020-10-25 DIAGNOSIS — L820 Inflamed seborrheic keratosis: Secondary | ICD-10-CM | POA: Diagnosis not present

## 2020-12-02 DIAGNOSIS — I10 Essential (primary) hypertension: Secondary | ICD-10-CM | POA: Diagnosis not present

## 2021-02-15 DIAGNOSIS — K219 Gastro-esophageal reflux disease without esophagitis: Secondary | ICD-10-CM | POA: Diagnosis not present

## 2021-02-15 DIAGNOSIS — E7800 Pure hypercholesterolemia, unspecified: Secondary | ICD-10-CM | POA: Diagnosis not present

## 2021-02-15 DIAGNOSIS — I10 Essential (primary) hypertension: Secondary | ICD-10-CM | POA: Diagnosis not present

## 2021-02-16 DIAGNOSIS — R3915 Urgency of urination: Secondary | ICD-10-CM | POA: Diagnosis not present

## 2021-02-25 DIAGNOSIS — R300 Dysuria: Secondary | ICD-10-CM | POA: Diagnosis not present

## 2021-04-12 DIAGNOSIS — Z79899 Other long term (current) drug therapy: Secondary | ICD-10-CM | POA: Diagnosis not present

## 2021-04-12 DIAGNOSIS — R7303 Prediabetes: Secondary | ICD-10-CM | POA: Diagnosis not present

## 2021-04-12 DIAGNOSIS — Z1159 Encounter for screening for other viral diseases: Secondary | ICD-10-CM | POA: Diagnosis not present

## 2021-04-12 DIAGNOSIS — E7800 Pure hypercholesterolemia, unspecified: Secondary | ICD-10-CM | POA: Diagnosis not present

## 2021-04-12 DIAGNOSIS — Z1389 Encounter for screening for other disorder: Secondary | ICD-10-CM | POA: Diagnosis not present

## 2021-04-12 DIAGNOSIS — Z0000 Encounter for general adult medical examination without abnormal findings: Secondary | ICD-10-CM | POA: Diagnosis not present

## 2021-04-12 DIAGNOSIS — K219 Gastro-esophageal reflux disease without esophagitis: Secondary | ICD-10-CM | POA: Diagnosis not present

## 2021-04-12 DIAGNOSIS — Z7189 Other specified counseling: Secondary | ICD-10-CM | POA: Diagnosis not present

## 2021-04-12 DIAGNOSIS — I10 Essential (primary) hypertension: Secondary | ICD-10-CM | POA: Diagnosis not present

## 2021-04-27 DIAGNOSIS — L298 Other pruritus: Secondary | ICD-10-CM | POA: Diagnosis not present

## 2021-04-27 DIAGNOSIS — L570 Actinic keratosis: Secondary | ICD-10-CM | POA: Diagnosis not present

## 2021-06-27 ENCOUNTER — Other Ambulatory Visit: Payer: Self-pay | Admitting: Geriatric Medicine

## 2021-06-27 DIAGNOSIS — Z1231 Encounter for screening mammogram for malignant neoplasm of breast: Secondary | ICD-10-CM

## 2021-06-29 DIAGNOSIS — M7062 Trochanteric bursitis, left hip: Secondary | ICD-10-CM | POA: Diagnosis not present

## 2021-07-05 DIAGNOSIS — D225 Melanocytic nevi of trunk: Secondary | ICD-10-CM | POA: Diagnosis not present

## 2021-07-05 DIAGNOSIS — L603 Nail dystrophy: Secondary | ICD-10-CM | POA: Diagnosis not present

## 2021-08-24 DIAGNOSIS — H5000 Unspecified esotropia: Secondary | ICD-10-CM | POA: Diagnosis not present

## 2021-08-24 DIAGNOSIS — H52203 Unspecified astigmatism, bilateral: Secondary | ICD-10-CM | POA: Diagnosis not present

## 2021-08-24 DIAGNOSIS — Z961 Presence of intraocular lens: Secondary | ICD-10-CM | POA: Diagnosis not present

## 2021-09-01 DIAGNOSIS — L821 Other seborrheic keratosis: Secondary | ICD-10-CM | POA: Diagnosis not present

## 2021-09-01 DIAGNOSIS — L570 Actinic keratosis: Secondary | ICD-10-CM | POA: Diagnosis not present

## 2021-09-01 DIAGNOSIS — Z85828 Personal history of other malignant neoplasm of skin: Secondary | ICD-10-CM | POA: Diagnosis not present

## 2021-09-01 DIAGNOSIS — L814 Other melanin hyperpigmentation: Secondary | ICD-10-CM | POA: Diagnosis not present

## 2021-09-05 DIAGNOSIS — I10 Essential (primary) hypertension: Secondary | ICD-10-CM | POA: Diagnosis not present

## 2021-09-05 DIAGNOSIS — E7800 Pure hypercholesterolemia, unspecified: Secondary | ICD-10-CM | POA: Diagnosis not present

## 2021-09-05 DIAGNOSIS — K219 Gastro-esophageal reflux disease without esophagitis: Secondary | ICD-10-CM | POA: Diagnosis not present

## 2021-09-30 ENCOUNTER — Ambulatory Visit
Admission: RE | Admit: 2021-09-30 | Discharge: 2021-09-30 | Disposition: A | Discharge status: Home / Self Care | Payer: Medicare Other | Source: Ambulatory Visit | Attending: Geriatric Medicine | Admitting: Geriatric Medicine

## 2021-09-30 DIAGNOSIS — Z1231 Encounter for screening mammogram for malignant neoplasm of breast: Secondary | ICD-10-CM | POA: Diagnosis not present

## 2021-10-12 DIAGNOSIS — I10 Essential (primary) hypertension: Secondary | ICD-10-CM | POA: Diagnosis not present

## 2021-10-12 DIAGNOSIS — R7303 Prediabetes: Secondary | ICD-10-CM | POA: Diagnosis not present

## 2021-10-12 DIAGNOSIS — Z79899 Other long term (current) drug therapy: Secondary | ICD-10-CM | POA: Diagnosis not present

## 2022-02-03 DIAGNOSIS — M79672 Pain in left foot: Secondary | ICD-10-CM | POA: Diagnosis not present

## 2022-04-18 DIAGNOSIS — Z79899 Other long term (current) drug therapy: Secondary | ICD-10-CM | POA: Diagnosis not present

## 2022-04-18 DIAGNOSIS — R7303 Prediabetes: Secondary | ICD-10-CM | POA: Diagnosis not present

## 2022-04-18 DIAGNOSIS — I10 Essential (primary) hypertension: Secondary | ICD-10-CM | POA: Diagnosis not present

## 2022-04-18 DIAGNOSIS — Z0000 Encounter for general adult medical examination without abnormal findings: Secondary | ICD-10-CM | POA: Diagnosis not present

## 2022-04-18 DIAGNOSIS — K909 Intestinal malabsorption, unspecified: Secondary | ICD-10-CM | POA: Diagnosis not present

## 2022-04-18 DIAGNOSIS — K219 Gastro-esophageal reflux disease without esophagitis: Secondary | ICD-10-CM | POA: Diagnosis not present

## 2022-04-18 DIAGNOSIS — E7800 Pure hypercholesterolemia, unspecified: Secondary | ICD-10-CM | POA: Diagnosis not present

## 2022-04-24 DIAGNOSIS — H905 Unspecified sensorineural hearing loss: Secondary | ICD-10-CM | POA: Diagnosis not present

## 2022-06-27 ENCOUNTER — Other Ambulatory Visit: Payer: Self-pay | Admitting: Geriatric Medicine

## 2022-06-27 ENCOUNTER — Other Ambulatory Visit: Payer: Self-pay | Admitting: Internal Medicine

## 2022-06-27 DIAGNOSIS — Z1231 Encounter for screening mammogram for malignant neoplasm of breast: Secondary | ICD-10-CM

## 2022-07-31 IMAGING — MG DIGITAL SCREENING BILAT W/ TOMO W/ CAD
8 series · 8 of 24 positions shown · non-contrast
Comparison: Previous exam(s).

CLINICAL DATA: Screening.

EXAM:
DIGITAL SCREENING BILATERAL MAMMOGRAM WITH TOMO AND CAD

[L CC synth-2D]
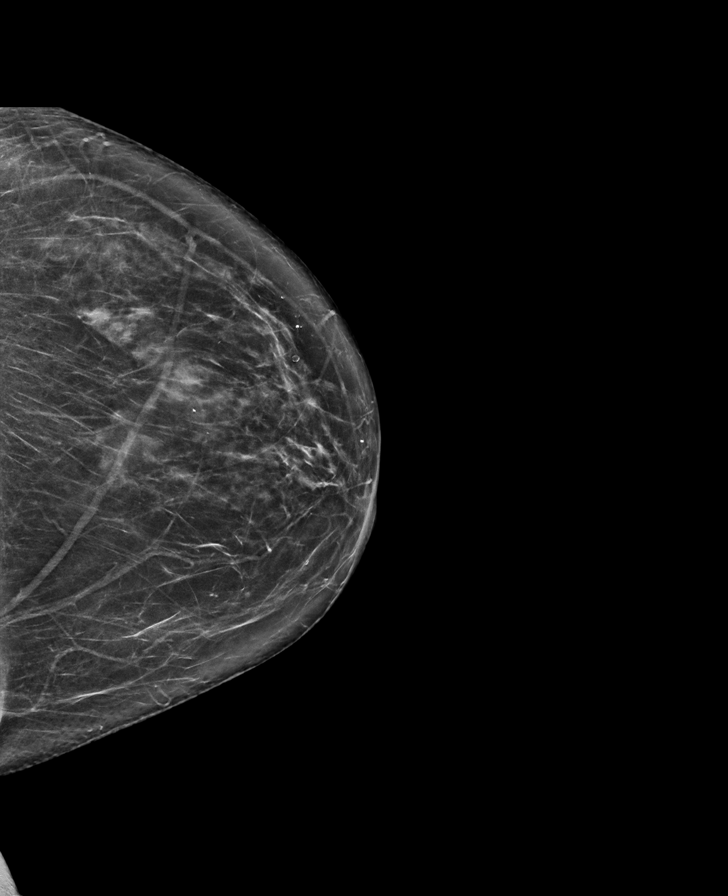

[R CC synth-2D]
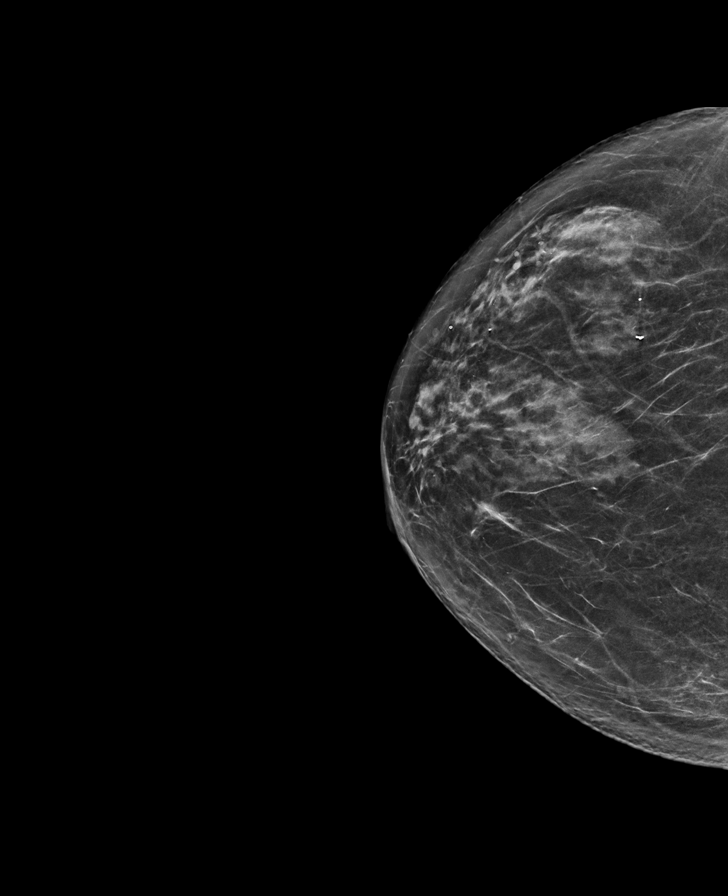

[R MLO synth-2D]
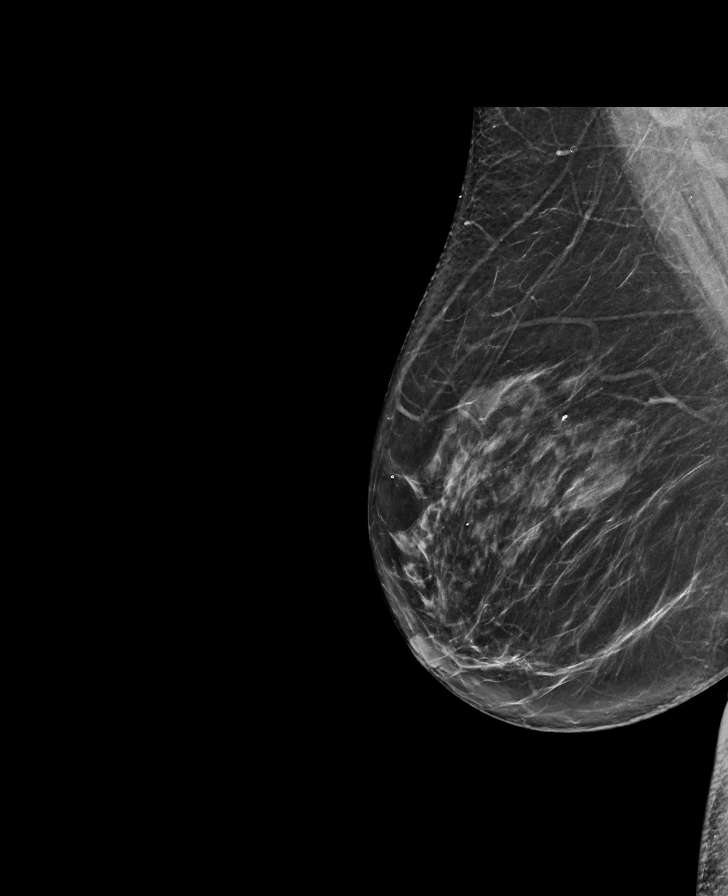

[L MLO synth-2D]
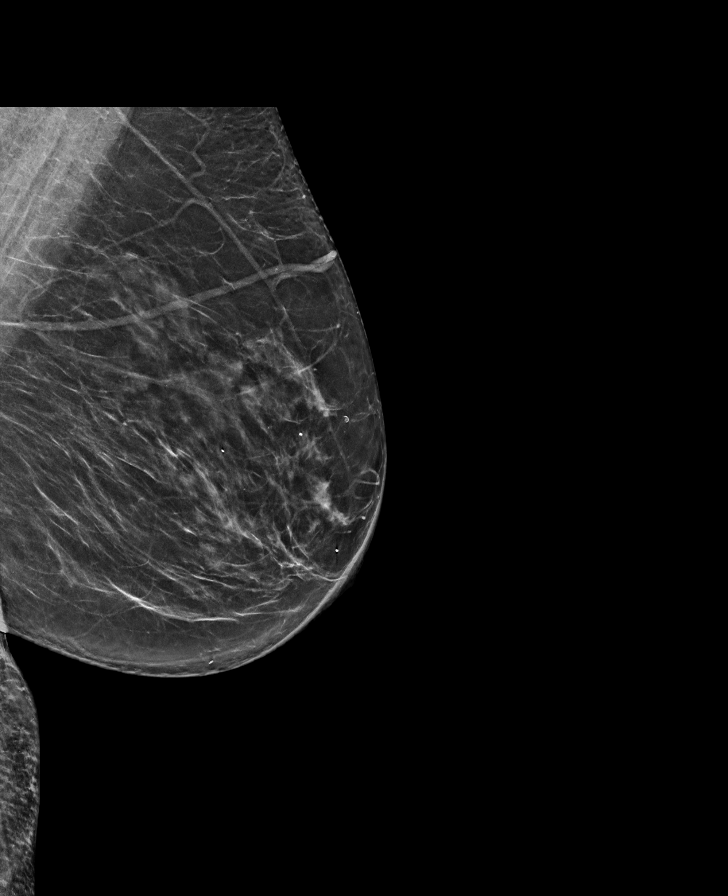

[L CC tomo · tomo slice 35/68.0]
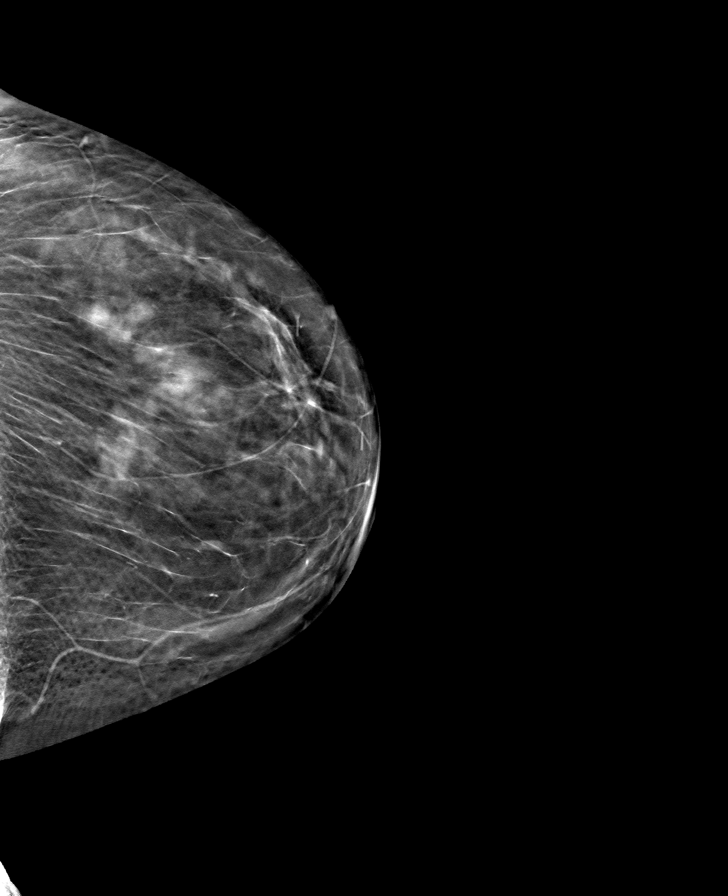

[R CC tomo · tomo slice 35/68.0]
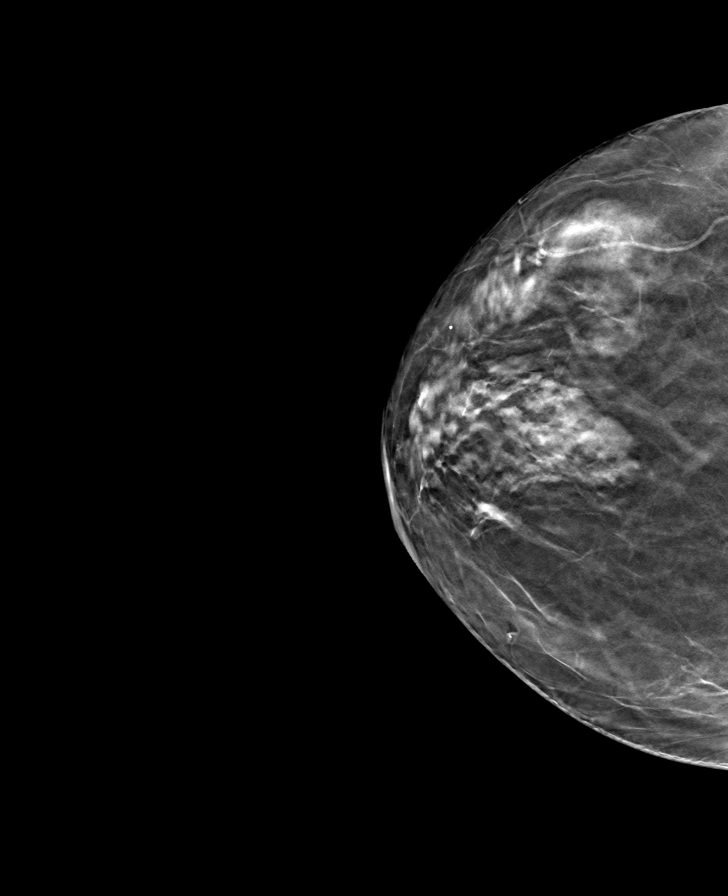

[L MLO tomo · tomo slice 37/72.0]
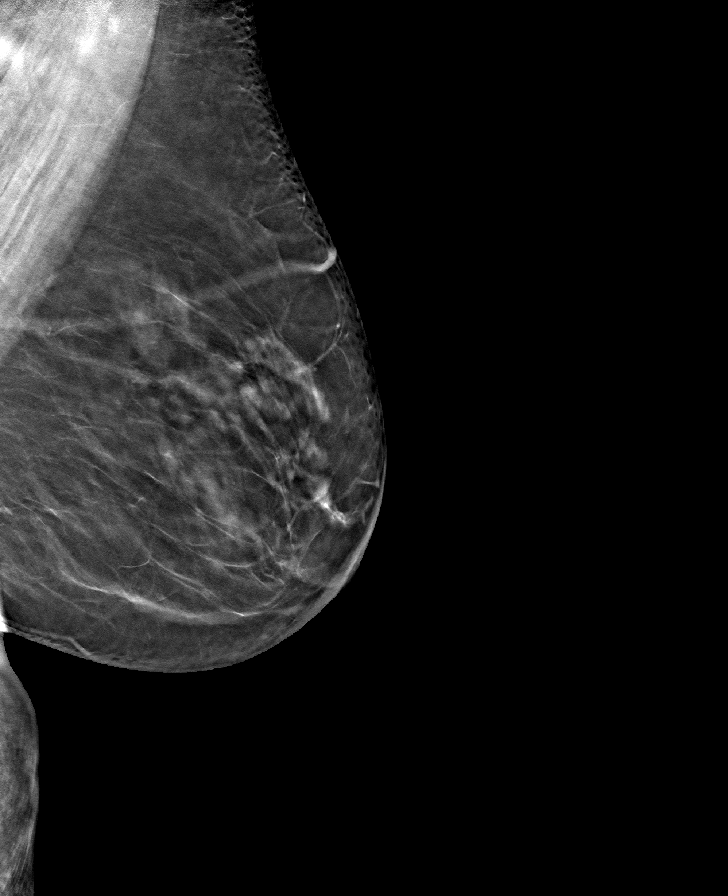

[R MLO tomo · tomo slice 38/75.0]
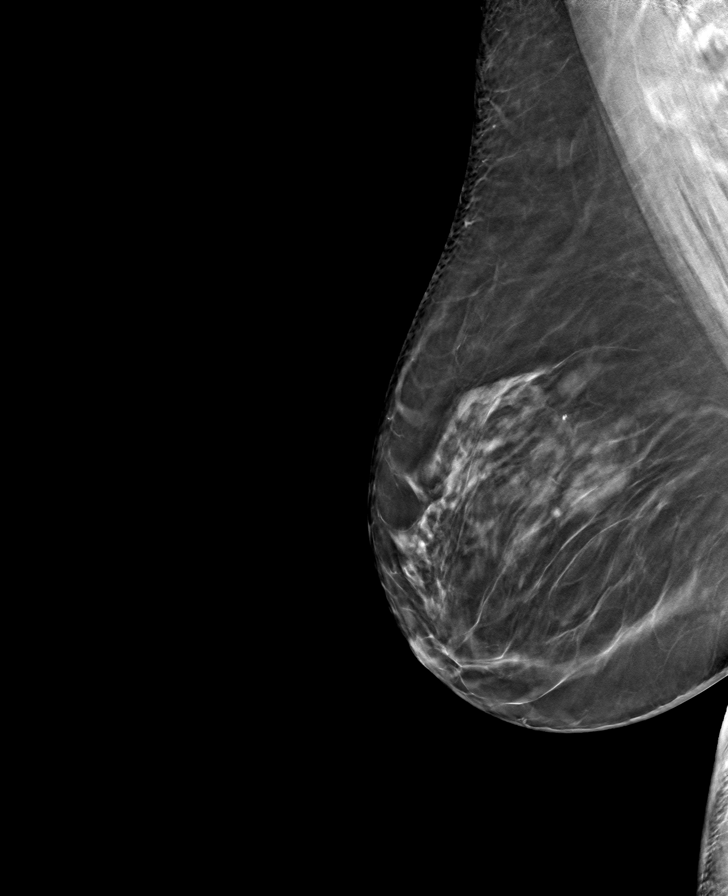

[8 of 24 positions shown; findings below may reference images not displayed]

ACR Breast Density Category b: There are scattered areas of
fibroglandular density.
FINDINGS: There are no findings suspicious for malignancy. Images were
processed with CAD.
IMPRESSION: No mammographic evidence of malignancy. A result letter of this
screening mammogram will be mailed directly to the patient.

RECOMMENDATION:
Screening mammogram in one year. (Code:[id number])

BI-RADS CATEGORY  1: Negative.

## 2022-09-04 DIAGNOSIS — Z85828 Personal history of other malignant neoplasm of skin: Secondary | ICD-10-CM | POA: Diagnosis not present

## 2022-09-04 DIAGNOSIS — L814 Other melanin hyperpigmentation: Secondary | ICD-10-CM | POA: Diagnosis not present

## 2022-09-04 DIAGNOSIS — L570 Actinic keratosis: Secondary | ICD-10-CM | POA: Diagnosis not present

## 2022-09-04 DIAGNOSIS — L821 Other seborrheic keratosis: Secondary | ICD-10-CM | POA: Diagnosis not present

## 2022-09-04 DIAGNOSIS — L820 Inflamed seborrheic keratosis: Secondary | ICD-10-CM | POA: Diagnosis not present

## 2022-09-13 DIAGNOSIS — Z961 Presence of intraocular lens: Secondary | ICD-10-CM | POA: Diagnosis not present

## 2022-09-13 DIAGNOSIS — H5000 Unspecified esotropia: Secondary | ICD-10-CM | POA: Diagnosis not present

## 2022-09-13 DIAGNOSIS — H52203 Unspecified astigmatism, bilateral: Secondary | ICD-10-CM | POA: Diagnosis not present

## 2022-09-13 DIAGNOSIS — H53002 Unspecified amblyopia, left eye: Secondary | ICD-10-CM | POA: Diagnosis not present

## 2022-09-19 DIAGNOSIS — U071 COVID-19: Secondary | ICD-10-CM | POA: Diagnosis not present

## 2022-09-19 DIAGNOSIS — J029 Acute pharyngitis, unspecified: Secondary | ICD-10-CM | POA: Diagnosis not present

## 2022-09-19 DIAGNOSIS — Z20822 Contact with and (suspected) exposure to covid-19: Secondary | ICD-10-CM | POA: Diagnosis not present

## 2022-10-03 ENCOUNTER — Ambulatory Visit: Payer: Medicare Other

## 2022-10-18 DIAGNOSIS — K219 Gastro-esophageal reflux disease without esophagitis: Secondary | ICD-10-CM | POA: Diagnosis not present

## 2022-10-18 DIAGNOSIS — I10 Essential (primary) hypertension: Secondary | ICD-10-CM | POA: Diagnosis not present

## 2022-10-18 DIAGNOSIS — R7303 Prediabetes: Secondary | ICD-10-CM | POA: Diagnosis not present

## 2022-10-18 DIAGNOSIS — E7800 Pure hypercholesterolemia, unspecified: Secondary | ICD-10-CM | POA: Diagnosis not present

## 2022-11-21 ENCOUNTER — Ambulatory Visit
Admission: RE | Admit: 2022-11-21 | Discharge: 2022-11-21 | Disposition: A | Discharge status: Home / Self Care | Payer: Medicare Other | Source: Ambulatory Visit | Attending: Internal Medicine | Admitting: Internal Medicine

## 2022-11-21 DIAGNOSIS — Z1231 Encounter for screening mammogram for malignant neoplasm of breast: Secondary | ICD-10-CM | POA: Diagnosis not present

## 2023-03-14 DIAGNOSIS — R339 Retention of urine, unspecified: Secondary | ICD-10-CM | POA: Diagnosis not present

## 2023-03-14 DIAGNOSIS — R82998 Other abnormal findings in urine: Secondary | ICD-10-CM | POA: Diagnosis not present

## 2023-04-02 DIAGNOSIS — R350 Frequency of micturition: Secondary | ICD-10-CM | POA: Diagnosis not present

## 2023-04-02 DIAGNOSIS — R300 Dysuria: Secondary | ICD-10-CM | POA: Diagnosis not present

## 2023-04-19 DIAGNOSIS — Z87440 Personal history of urinary (tract) infections: Secondary | ICD-10-CM | POA: Diagnosis not present

## 2023-04-19 DIAGNOSIS — N390 Urinary tract infection, site not specified: Secondary | ICD-10-CM | POA: Diagnosis not present

## 2023-04-30 DIAGNOSIS — M85852 Other specified disorders of bone density and structure, left thigh: Secondary | ICD-10-CM | POA: Diagnosis not present

## 2023-04-30 DIAGNOSIS — E7800 Pure hypercholesterolemia, unspecified: Secondary | ICD-10-CM | POA: Diagnosis not present

## 2023-04-30 DIAGNOSIS — R7303 Prediabetes: Secondary | ICD-10-CM | POA: Diagnosis not present

## 2023-04-30 DIAGNOSIS — I10 Essential (primary) hypertension: Secondary | ICD-10-CM | POA: Diagnosis not present

## 2023-04-30 DIAGNOSIS — K219 Gastro-esophageal reflux disease without esophagitis: Secondary | ICD-10-CM | POA: Diagnosis not present

## 2023-04-30 DIAGNOSIS — Z79899 Other long term (current) drug therapy: Secondary | ICD-10-CM | POA: Diagnosis not present

## 2023-04-30 DIAGNOSIS — F5101 Primary insomnia: Secondary | ICD-10-CM | POA: Diagnosis not present

## 2023-04-30 DIAGNOSIS — Z0000 Encounter for general adult medical examination without abnormal findings: Secondary | ICD-10-CM | POA: Diagnosis not present

## 2023-04-30 DIAGNOSIS — K909 Intestinal malabsorption, unspecified: Secondary | ICD-10-CM | POA: Diagnosis not present

## 2023-05-07 ENCOUNTER — Other Ambulatory Visit: Payer: Self-pay | Admitting: Internal Medicine

## 2023-05-07 DIAGNOSIS — M85852 Other specified disorders of bone density and structure, left thigh: Secondary | ICD-10-CM

## 2023-06-18 ENCOUNTER — Other Ambulatory Visit: Payer: Self-pay | Admitting: Internal Medicine

## 2023-06-18 DIAGNOSIS — Z1231 Encounter for screening mammogram for malignant neoplasm of breast: Secondary | ICD-10-CM

## 2023-07-19 DIAGNOSIS — I10 Essential (primary) hypertension: Secondary | ICD-10-CM | POA: Diagnosis not present

## 2023-08-10 DIAGNOSIS — I10 Essential (primary) hypertension: Secondary | ICD-10-CM | POA: Diagnosis not present

## 2023-08-24 ENCOUNTER — Emergency Department (HOSPITAL_BASED_OUTPATIENT_CLINIC_OR_DEPARTMENT_OTHER)
Admission: EM | Admit: 2023-08-24 | Discharge: 2023-08-24 | Disposition: A | Discharge status: Home / Self Care | Payer: Medicare Other | Attending: Emergency Medicine | Admitting: Emergency Medicine

## 2023-08-24 ENCOUNTER — Other Ambulatory Visit: Payer: Self-pay

## 2023-08-24 DIAGNOSIS — I10 Essential (primary) hypertension: Secondary | ICD-10-CM | POA: Diagnosis not present

## 2023-08-24 DIAGNOSIS — R519 Headache, unspecified: Secondary | ICD-10-CM | POA: Diagnosis present

## 2023-08-24 NOTE — ED Triage Notes (Signed)
Pt POV ambulatory to triage reporting HTN (highest 169/71). Took additional dose amlodipine about 2 hours ago. BP 169/96 in triage. Also reporting dull headache

## 2023-08-24 NOTE — ED Provider Notes (Signed)
  [place] EMERGENCY DEPARTMENT AT [facility] Provider Note   CSN: [id number] Arrival date & time: 08/24/23  1709     History  No chief complaint on file.   Brooke Osborne is a 82 y.o. female.  HPI Patient presents with high blood pressure.  States she checked it at home and it was high in the 160s.  Went up to mildly elevated on the diastolic also.  Has a slight headache at times.  No chest pain.  No trouble breathing.  Patient has been seeing her PCP and has had some change in her medications.  Is on amlodipine at 2.5 mg and can take an extra dose.  Is also on 2 other blood pressure medications.  States she is worried just because it was high.   No past medical history on file. Patient does have history of hypertension. Home Medications Prior to Admission medications   Not on File      Allergies    Patient has no allergy information on record.    Review of Systems   Review of Systems  Physical Exam Updated Vital Signs BP (!) 168/64   Pulse (!) 102   Temp 99.6 F (37.6 C)   Resp 16   Ht 5\' 4"  (1.626 m)   Wt 71.7 kg   SpO2 96%   BMI 27.12 kg/m  Physical Exam Vitals and nursing note reviewed.  Cardiovascular:     Rate and Rhythm: Regular rhythm.  Pulmonary:     Breath sounds: No wheezing.  Neurological:     Mental Status: She is alert and oriented to person, place, and time. Mental status is at baseline.     ED Results / Procedures / Treatments   Labs (all labs ordered are listed, but only abnormal results are displayed) Labs Reviewed - No data to display  EKG None  Radiology No results found.  Procedures Procedures    Medications Ordered in ED Medications - No data to display  ED Course/ Medical Decision Making/ A&P                                 Medical Decision Making  Patient with hypertension.  However appears asymptomatic.  May have a mild headache.  No chest pain no trouble breathing.  Doubt acute endorgan damage.   Patient is already titrating medicines both with her PCP and a little bit on her own.  Do not think we need acute intervention at this time.  Will be following blood pressure at home.  Is increasing amlodipine if needed.  Appears stable for discharge home.        Final Clinical Impression(s) / ED Diagnoses Final diagnoses:  Hypertension, unspecified type    Rx / DC Orders ED Discharge Orders     None         Benjiman Core, MD 08/24/23 1945

## 2023-09-05 DIAGNOSIS — L821 Other seborrheic keratosis: Secondary | ICD-10-CM | POA: Diagnosis not present

## 2023-09-05 DIAGNOSIS — L570 Actinic keratosis: Secondary | ICD-10-CM | POA: Diagnosis not present

## 2023-09-05 DIAGNOSIS — Z85828 Personal history of other malignant neoplasm of skin: Secondary | ICD-10-CM | POA: Diagnosis not present

## 2023-09-05 DIAGNOSIS — D045 Carcinoma in situ of skin of trunk: Secondary | ICD-10-CM | POA: Diagnosis not present

## 2023-09-05 DIAGNOSIS — L814 Other melanin hyperpigmentation: Secondary | ICD-10-CM | POA: Diagnosis not present

## 2023-09-14 ENCOUNTER — Other Ambulatory Visit: Payer: Self-pay

## 2023-09-14 ENCOUNTER — Encounter (HOSPITAL_BASED_OUTPATIENT_CLINIC_OR_DEPARTMENT_OTHER): Payer: Self-pay

## 2023-09-14 ENCOUNTER — Emergency Department (HOSPITAL_BASED_OUTPATIENT_CLINIC_OR_DEPARTMENT_OTHER): Payer: Medicare Other

## 2023-09-14 ENCOUNTER — Inpatient Hospital Stay (HOSPITAL_BASED_OUTPATIENT_CLINIC_OR_DEPARTMENT_OTHER)
Admission: EM | Admit: 2023-09-14 | Discharge: 2023-09-17 | DRG: 871 | Disposition: A | Discharge status: Home / Self Care | Payer: Medicare Other | Attending: Internal Medicine | Admitting: Internal Medicine

## 2023-09-14 DIAGNOSIS — R7303 Prediabetes: Secondary | ICD-10-CM | POA: Diagnosis present

## 2023-09-14 DIAGNOSIS — Z1152 Encounter for screening for COVID-19: Secondary | ICD-10-CM

## 2023-09-14 DIAGNOSIS — Z888 Allergy status to other drugs, medicaments and biological substances status: Secondary | ICD-10-CM | POA: Diagnosis not present

## 2023-09-14 DIAGNOSIS — Z87891 Personal history of nicotine dependence: Secondary | ICD-10-CM | POA: Diagnosis not present

## 2023-09-14 DIAGNOSIS — E876 Hypokalemia: Secondary | ICD-10-CM | POA: Diagnosis not present

## 2023-09-14 DIAGNOSIS — J189 Pneumonia, unspecified organism: Secondary | ICD-10-CM | POA: Diagnosis present

## 2023-09-14 DIAGNOSIS — Z79899 Other long term (current) drug therapy: Secondary | ICD-10-CM | POA: Diagnosis not present

## 2023-09-14 DIAGNOSIS — K219 Gastro-esophageal reflux disease without esophagitis: Secondary | ICD-10-CM

## 2023-09-14 DIAGNOSIS — R918 Other nonspecific abnormal finding of lung field: Secondary | ICD-10-CM | POA: Diagnosis not present

## 2023-09-14 DIAGNOSIS — I10 Essential (primary) hypertension: Secondary | ICD-10-CM | POA: Diagnosis not present

## 2023-09-14 DIAGNOSIS — K449 Diaphragmatic hernia without obstruction or gangrene: Secondary | ICD-10-CM | POA: Diagnosis present

## 2023-09-14 DIAGNOSIS — A419 Sepsis, unspecified organism: Principal | ICD-10-CM

## 2023-09-14 DIAGNOSIS — J9601 Acute respiratory failure with hypoxia: Secondary | ICD-10-CM | POA: Diagnosis not present

## 2023-09-14 DIAGNOSIS — K5732 Diverticulitis of large intestine without perforation or abscess without bleeding: Secondary | ICD-10-CM | POA: Diagnosis present

## 2023-09-14 DIAGNOSIS — K5792 Diverticulitis of intestine, part unspecified, without perforation or abscess without bleeding: Secondary | ICD-10-CM

## 2023-09-14 DIAGNOSIS — E041 Nontoxic single thyroid nodule: Secondary | ICD-10-CM | POA: Diagnosis not present

## 2023-09-14 DIAGNOSIS — E872 Acidosis, unspecified: Secondary | ICD-10-CM | POA: Diagnosis not present

## 2023-09-14 DIAGNOSIS — N281 Cyst of kidney, acquired: Secondary | ICD-10-CM | POA: Diagnosis not present

## 2023-09-14 DIAGNOSIS — E785 Hyperlipidemia, unspecified: Secondary | ICD-10-CM

## 2023-09-14 DIAGNOSIS — R079 Chest pain, unspecified: Secondary | ICD-10-CM | POA: Diagnosis not present

## 2023-09-14 LAB — I-STAT ARTERIAL BLOOD GAS, ED
Acid-base deficit: 3 mmol/L — ABNORMAL HIGH (ref 0.0–2.0)
Bicarbonate: 22.1 mmol/L (ref 20.0–28.0)
Calcium, Ion: 1.16 mmol/L (ref 1.15–1.40)
HCT: 40 % (ref 36.0–46.0)
Hemoglobin: 13.6 g/dL (ref 12.0–15.0)
O2 Saturation: 90 %
Patient temperature: 99.6
Potassium: 2.8 mmol/L — ABNORMAL LOW (ref 3.5–5.1)
Sodium: 142 mmol/L (ref 135–145)
TCO2: 23 mmol/L (ref 22–32)
pCO2 arterial: 39.6 mm[Hg] (ref 32–48)
pH, Arterial: 7.357 (ref 7.35–7.45)
pO2, Arterial: 62 mm[Hg] — ABNORMAL LOW (ref 83–108)

## 2023-09-14 LAB — URINALYSIS, ROUTINE W REFLEX MICROSCOPIC
Bilirubin Urine: NEGATIVE
Glucose, UA: NEGATIVE mg/dL
Hgb urine dipstick: NEGATIVE
Ketones, ur: NEGATIVE mg/dL
Leukocytes,Ua: NEGATIVE
Nitrite: NEGATIVE
Protein, ur: 30 mg/dL — AB
Specific Gravity, Urine: 1.024 (ref 1.005–1.030)
pH: 5.5 (ref 5.0–8.0)

## 2023-09-14 LAB — RESP PANEL BY RT-PCR (RSV, FLU A&B, COVID)  RVPGX2
Influenza A by PCR: NEGATIVE
Influenza B by PCR: NEGATIVE
Resp Syncytial Virus by PCR: NEGATIVE
SARS Coronavirus 2 by RT PCR: NEGATIVE

## 2023-09-14 LAB — COMPREHENSIVE METABOLIC PANEL
ALT: 26 U/L (ref 0–44)
AST: 26 U/L (ref 15–41)
Albumin: 3.5 g/dL (ref 3.5–5.0)
Alkaline Phosphatase: 57 U/L (ref 38–126)
Anion gap: 11 (ref 5–15)
BUN: 17 mg/dL (ref 8–23)
CO2: 24 mmol/L (ref 22–32)
Calcium: 8 mg/dL — ABNORMAL LOW (ref 8.9–10.3)
Chloride: 106 mmol/L (ref 98–111)
Creatinine, Ser: 0.61 mg/dL (ref 0.44–1.00)
GFR, Estimated: >60 mL/min (ref >60)
Glucose, Bld: 158 mg/dL — ABNORMAL HIGH (ref 70–99)
Potassium: 3.2 mmol/L — ABNORMAL LOW (ref 3.5–5.1)
Sodium: 141 mmol/L (ref 135–145)
Total Bilirubin: 0.5 mg/dL (ref <1.2)
Total Protein: 6.2 g/dL — ABNORMAL LOW (ref 6.5–8.1)

## 2023-09-14 LAB — CBC
HCT: 43.4 % (ref 36.0–46.0)
Hemoglobin: 14.5 g/dL (ref 12.0–15.0)
MCH: 29.9 pg (ref 26.0–34.0)
MCHC: 33.4 g/dL (ref 30.0–36.0)
MCV: 89.5 fL (ref 80.0–100.0)
Platelets: 233 10*3/uL (ref 150–400)
RBC: 4.85 MIL/uL (ref 3.87–5.11)
RDW: 12.7 % (ref 11.5–15.5)
WBC: 7.5 10*3/uL (ref 4.0–10.5)
nRBC: 0 % (ref 0.0–0.2)

## 2023-09-14 LAB — PROCALCITONIN: Procalcitonin: 16.75 ng/mL

## 2023-09-14 LAB — I-STAT VENOUS BLOOD GAS, ED
Acid-base deficit: 2 mmol/L (ref 0.0–2.0)
Bicarbonate: 22.8 mmol/L (ref 20.0–28.0)
Calcium, Ion: 1.14 mmol/L — ABNORMAL LOW (ref 1.15–1.40)
HCT: 39 % (ref 36.0–46.0)
Hemoglobin: 13.3 g/dL (ref 12.0–15.0)
O2 Saturation: 91 %
Potassium: 2.9 mmol/L — ABNORMAL LOW (ref 3.5–5.1)
Sodium: 142 mmol/L (ref 135–145)
TCO2: 24 mmol/L (ref 22–32)
pCO2, Ven: 38.1 mm[Hg] — ABNORMAL LOW (ref 44–60)
pH, Ven: 7.385 (ref 7.25–7.43)
pO2, Ven: 61 mm[Hg] — ABNORMAL HIGH (ref 32–45)

## 2023-09-14 LAB — TROPONIN I (HIGH SENSITIVITY)
Troponin I (High Sensitivity): 5 ng/L (ref <18)
Troponin I (High Sensitivity): 9 ng/L (ref <18)

## 2023-09-14 LAB — RESPIRATORY PANEL BY PCR

## 2023-09-14 LAB — LACTIC ACID, PLASMA
Lactic Acid, Venous: 2 mmol/L (ref 0.5–1.9)
Lactic Acid, Venous: 1.6 mmol/L (ref 0.5–1.9)

## 2023-09-14 LAB — BRAIN NATRIURETIC PEPTIDE: B Natriuretic Peptide: 24.8 pg/mL (ref 0.0–100.0)

## 2023-09-14 LAB — MAGNESIUM: Magnesium: 1.5 mg/dL — ABNORMAL LOW (ref 1.7–2.4)

## 2023-09-14 LAB — STREP PNEUMONIAE URINARY ANTIGEN: Strep Pneumo Urinary Antigen: NEGATIVE

## 2023-09-14 LAB — LIPASE, BLOOD: Lipase: 17 U/L (ref 11–51)

## 2023-09-14 MED ORDER — ROSUVASTATIN CALCIUM 10 MG PO TABS
10.0000 mg | ORAL_TABLET | Freq: Every day | ORAL | Status: DC
  Administered 2023-09-14 – 2023-09-16 (×3): 10 mg via ORAL
  Filled 2023-09-14 (×3): qty 1

## 2023-09-14 MED ORDER — ONDANSETRON HCL 4 MG/2ML IJ SOLN: 4.0000 mg | Freq: Four times a day (QID) | INTRAMUSCULAR | Status: DC | PRN

## 2023-09-14 MED ORDER — SODIUM CHLORIDE 0.9 % IV SOLN
2.0000 g | INTRAVENOUS | Status: DC
  Administered 2023-09-14 – 2023-09-16 (×3): 2 g via INTRAVENOUS
  Filled 2023-09-14 (×3): qty 20

## 2023-09-14 MED ORDER — SODIUM CHLORIDE 0.9 % IV BOLUS
1000.0000 mL | Freq: Once | INTRAVENOUS | Status: AC
  Administered 2023-09-14: 1000 mL via INTRAVENOUS

## 2023-09-14 MED ORDER — ALBUTEROL SULFATE (2.5 MG/3ML) 0.083% IN NEBU
2.5000 mg | INHALATION_SOLUTION | Freq: Four times a day (QID) | RESPIRATORY_TRACT | Status: DC | PRN
Start: 2023-09-14 — End: 2023-09-17

## 2023-09-14 MED ORDER — ACETAMINOPHEN 650 MG RE SUPP: 650.0000 mg | Freq: Four times a day (QID) | RECTAL | Status: DC | PRN

## 2023-09-14 MED ORDER — AZITHROMYCIN 500 MG IV SOLR
500.0000 mg | INTRAVENOUS | Status: DC
  Filled 2023-09-14 (×2): qty 5

## 2023-09-14 MED ORDER — METRONIDAZOLE 500 MG/100ML IV SOLN
500.0000 mg | Freq: Two times a day (BID) | INTRAVENOUS | Status: DC
  Administered 2023-09-14 – 2023-09-17 (×6): 500 mg via INTRAVENOUS
  Filled 2023-09-14 (×6): qty 100

## 2023-09-14 MED ORDER — ONDANSETRON HCL 4 MG PO TABS: 4.0000 mg | ORAL_TABLET | Freq: Four times a day (QID) | ORAL | Status: DC | PRN

## 2023-09-14 MED ORDER — POTASSIUM CHLORIDE CRYS ER 20 MEQ PO TBCR
40.0000 meq | EXTENDED_RELEASE_TABLET | Freq: Once | ORAL | Status: AC
  Administered 2023-09-14: 40 meq via ORAL
  Filled 2023-09-14: qty 2

## 2023-09-14 MED ORDER — IOHEXOL 300 MG/ML  SOLN
100.0000 mL | Freq: Once | INTRAMUSCULAR | Status: AC | PRN
  Administered 2023-09-14: 100 mL via INTRAVENOUS

## 2023-09-14 MED ORDER — TRAZODONE HCL 50 MG PO TABS
50.0000 mg | ORAL_TABLET | Freq: Every evening | ORAL | Status: DC | PRN
  Administered 2023-09-15 – 2023-09-16 (×2): 50 mg via ORAL
  Filled 2023-09-14 (×2): qty 1

## 2023-09-14 MED ORDER — PIPERACILLIN-TAZOBACTAM 3.375 G IVPB 30 MIN
3.3750 g | Freq: Once | INTRAVENOUS | Status: AC
  Administered 2023-09-14: 3.375 g via INTRAVENOUS
  Filled 2023-09-14: qty 50

## 2023-09-14 MED ORDER — PANTOPRAZOLE SODIUM 40 MG PO TBEC
40.0000 mg | DELAYED_RELEASE_TABLET | Freq: Every day | ORAL | Status: DC
  Administered 2023-09-14 – 2023-09-17 (×4): 40 mg via ORAL
  Filled 2023-09-14 (×4): qty 1

## 2023-09-14 MED ORDER — ACETAMINOPHEN 325 MG PO TABS
650.0000 mg | ORAL_TABLET | Freq: Once | ORAL | Status: AC
  Administered 2023-09-14: 650 mg via ORAL
  Filled 2023-09-14: qty 2

## 2023-09-14 MED ORDER — ACETAMINOPHEN 325 MG PO TABS: 650.0000 mg | ORAL_TABLET | Freq: Four times a day (QID) | ORAL | Status: DC | PRN

## 2023-09-14 MED ORDER — AMLODIPINE BESYLATE 10 MG PO TABS
5.0000 mg | ORAL_TABLET | Freq: Every day | ORAL | Status: DC
  Administered 2023-09-14 – 2023-09-17 (×4): 5 mg via ORAL
  Filled 2023-09-14 (×4): qty 1

## 2023-09-14 MED ORDER — MAGNESIUM SULFATE 2 GM/50ML IV SOLN
2.0000 g | Freq: Once | INTRAVENOUS | Status: AC
  Administered 2023-09-14: 2 g via INTRAVENOUS
  Filled 2023-09-14: qty 50

## 2023-09-14 MED ORDER — ENOXAPARIN SODIUM 40 MG/0.4ML IJ SOSY
40.0000 mg | PREFILLED_SYRINGE | INTRAMUSCULAR | Status: DC
  Administered 2023-09-14 – 2023-09-16 (×3): 40 mg via SUBCUTANEOUS
  Filled 2023-09-14 (×3): qty 0.4

## 2023-09-14 NOTE — Assessment & Plan Note (Signed)
82 year old female with one week history of coughing, abdominal pain, N/V/D found to be hypoxic down to the 80s on room air with multifocal pneumonia and moderate diffuse wall thickening of the sigmoid colon prior diverticulitis vs. Acute inflammation -admit to progressive -? Aspiration pneumonia with innumerable episodes of vomiting and acute hypoxia  -multifocal pneumonia, predominantly in RLL  -continue antibiotics with rocephin/azithromycin and flagyl to cover for pneumonia +diverticulitis  -BC pending  -sputum culture ordered -urinary antigens ordered -covid/flu/RSV negative -RVP and PCT ordered -feeling much better -trend LA, will give IVF if continue to trend upward. Encouraged oral intake

## 2023-09-14 NOTE — ED Notes (Addendum)
Report attempted to the floor.   

## 2023-09-14 NOTE — ED Notes (Signed)
Pt aware of the need for a urine... Unable to currently provide the sample... 

## 2023-09-14 NOTE — ED Notes (Signed)
RT placed pt on [place] 5 liters w/a sat prior to placement of 82% on RA. Pt BLBS rhonchi/coarse. Pt respiratory status is stable w/no distress noted. RT will continue to monitor while @[facility] .

## 2023-09-14 NOTE — ED Notes (Signed)
I-Stat performed by RT.

## 2023-09-14 NOTE — ED Notes (Addendum)
RT Note:Patient was changed to an 8L [place] cannula. The simple mask was hurting patients face. Patient was placed on the same FIO2 and tolerating well at this time with oxygen saturation maintaining 94%. Physician aware

## 2023-09-14 NOTE — H&P (Signed)
History and Physical    Patient: Brooke Osborne [id number]:[id number] DOB: 1941/02/13 DOA: 09/14/2023 DOS: the patient was seen and examined on 09/14/2023 PCP: Thana Ates, MD  Patient coming from:  [facility]  - lives alone. Ambulates independently    Chief Complaint: diarrhea/abdominal pain, N/V.   HPI: Brooke Osborne is a 82 y.o. female with significant medical history of HTN, GERD, hiatal hernia, prediabetes, HLD who presented to ED with complaints of diarrhea, vomiting and coughing. She states last Friday she started to have some rattling in her chest. Sunday she was tired and started to have copious amount of vomiting and diarrhea through the night.  Tuesday she felt better and had used some nausea pills. Tuesday night she started to cough and felt rattling. Friday morning after vomiting she started to shake and called a friend to come Her friend thought she looked bad so took her to ED. She has had some intermittent stomach pain, but thought due to gas. Pain in lower quadrants with distention. Pain rated as an 8-9/10, relieved with BM.  Last had diarrhea and vomiting this AM. No fever, but rigors this AM. She denies any blood in her stool. She doesn't really feel short of breath, but has noticed she has been breathing with her mouth open over the last few days which is atypical for her. She has had the drive cough for about one week that has progressively gotten worse. No sick contacts that she is aware of. No recent diverticulitis.   Denies any fevers, vision changes, + headaches, chest pain or palpitations, shortness of breath,  dysuria or leg swelling.    She does not smoke or drink alcohol.   ER Course:  vitals: temp: 99.6, bp: 128/55, HR: 110, RR: 36, oxygen: 91% on 6L oxygen (oxygen was 82% on RA with EMS)  Pertinent labs: covid/flu/RSV negative, potassium: 3.2, mag: 1.5, arterial ABG: ph: 7.357, O2: 61, lactic acid: 2.0 CXR: infiltrate at right base, usually pneumonia  CT abdomen/pelvis:  Bilateral lung opacities are noted, most prominently seen in right lower lobe, consistent with multifocal pneumonia. Sigmoid diverticulosis is noted. Moderate diffuse wall thickening of sigmoid colon is noted which may simply represent chronic sequela of prior diverticulitis, but acute inflammation cannot be excluded, although no surrounding inflammatory changes are noted at this time.  In ED: given tylenol, magnesium, potassium, 1L IVF bolus and started on zosyn. BC obtained. TRH asked to admit.     Review of Systems: As mentioned in the history of present illness. All other systems reviewed and are negative. History reviewed. No pertinent past medical history. Past Surgical History:  Procedure Laterality Date   ABDOMINAL HYSTERECTOMY     BREAST EXCISIONAL BIOPSY Left    Social History:  reports that she has quit smoking. Her smoking use included cigarettes. She has never used smokeless tobacco. She reports current alcohol use. She reports that she does not use drugs.  No Known Allergies  Family History  Problem Relation Age of Onset   Breast cancer Neg Hx     Prior to Admission medications   Not on File    Physical Exam: Vitals:   09/14/23 1130 09/14/23 1230 09/14/23 1400 09/14/23 1515  BP: (!) 108/46 (!) 107/54 (!) 122/58 (!) 131/50  Pulse: 84 87 86 87  Resp: (!) 23 (!) 26 (!) 23 20  Temp:  98.2 F (36.8 C)  98.9 F (37.2 C)  TempSrc:    Oral  SpO2: 94% 98% 98% 97%  Weight:      Height:       General:  Appears calm and comfortable and is in NAD Eyes:  PERRL, EOMI, normal lids, iris ENT:  HOH, lips & tongue, mmm; appropriate dentition Neck:  no LAD, masses or thyromegaly; no carotid bruits Cardiovascular:  RRR, no m/r/g. No LE edema.  Respiratory:   crackles in RLL,  Normal respiratory effort. Abdomen:  soft, NT, ND, NABS Back:   normal alignment, no CVAT Skin:  no rash or induration seen on limited exam Musculoskeletal:  grossly normal tone BUE/BLE, good ROM, no  bony abnormality Lower extremity:  No LE edema.  Limited foot exam with no ulcerations.  2+ distal pulses. Psychiatric:  grossly normal mood and affect, speech fluent and appropriate, AOx3 Neurologic:  CN 2-12 grossly intact, moves all extremities in coordinated fashion, sensation intact   Radiological Exams on Admission: Independently reviewed - see discussion in A/P where applicable  CT CHEST ABDOMEN PELVIS W CONTRAST Result Date: 09/14/2023 CLINICAL DATA:  Hypoxia, vomiting, generalized abdominal pain. EXAM: CT CHEST, ABDOMEN, AND PELVIS WITH CONTRAST TECHNIQUE: Multidetector CT imaging of the chest, abdomen and pelvis was performed following the standard protocol during bolus administration of intravenous contrast. RADIATION DOSE REDUCTION: This exam was performed according to the departmental dose-optimization program which includes automated exposure control, adjustment of the mA and/or kV according to patient size and/or use of iterative reconstruction technique. CONTRAST:  OMNIPAQUE IOHEXOL 300 MG/ML  SOLN COMPARISON:  Radiograph of same day. FINDINGS: CT CHEST FINDINGS Cardiovascular: Atherosclerosis of thoracic aorta is noted without aneurysm or dissection. Normal cardiac size. No pericardial effusion. Mediastinum/Nodes: 1 cm calcified right thyroid lobe nodule is noted. Esophagus is unremarkable. No adenopathy. Lungs/Pleura: No pneumothorax or pleural effusion is noted. Large airspace opacity is noted in right lower lobe consistent with pneumonia. Smaller opacity is noted in left lower lobe also concerning for pneumonia. Smaller patchy airspace opacities are noted in right upper lobe also consistent with pneumonia. Musculoskeletal: No chest wall mass or suspicious bone lesions identified. CT ABDOMEN PELVIS FINDINGS Hepatobiliary: No focal liver abnormality is seen. Status post cholecystectomy. No biliary dilatation. Pancreas: Unremarkable. No pancreatic ductal dilatation or surrounding  inflammatory changes. Spleen: Normal in size without focal abnormality. Adrenals/Urinary Tract: Adrenal glands appear normal. Left renal cyst is noted for which no further follow-up is required. No hydronephrosis or renal obstruction is noted. Urinary bladder is unremarkable. Stomach/Bowel: Stomach and appendix are unremarkable. There is no evidence of bowel obstruction or inflammation. Sigmoid diverticulosis is noted. Moderate diffuse wall thickening of sigmoid colon is noted which may represent chronic sequela of prior inflammation, but acute inflammation cannot be excluded, although no surrounding inflammatory changes are noted. Vascular/Lymphatic: Aortic atherosclerosis. No enlarged abdominal or pelvic lymph nodes. Reproductive: Status post hysterectomy. No adnexal masses. Other: No abdominal wall hernia or abnormality. No abdominopelvic ascites. Musculoskeletal: No acute or significant osseous findings. IMPRESSION: Bilateral lung opacities are noted, most prominently seen in right lower lobe, consistent with multifocal pneumonia. Sigmoid diverticulosis is noted. Moderate diffuse wall thickening of sigmoid colon is noted which may simply represent chronic sequela of prior diverticulitis, but acute inflammation cannot be excluded, although no surrounding inflammatory changes are noted at this time. 1 cm calcified right thyroid nodule. Not clinically significant; no follow-up imaging recommended. (Ref: J Am Coll Radiol. 2015 Feb;12(2): 143-50). Aortic Atherosclerosis (ICD10-I70.0). Electronically Signed   By: Lupita Raider M.D.   On: 09/14/2023 08:51   DG Chest Portable 1 View Result Date:  09/14/2023 CLINICAL DATA:  Evaluate for hypoxia. Generalized abdominal and back pain EXAM: PORTABLE CHEST 1 VIEW COMPARISON:  None Available. FINDINGS: Asymmetric infiltrate at the right base, usually pneumonia. Normal heart size and mediastinal contours for portable technique. No convincing effusion and no pneumothorax.  Artifact from EKG leads. IMPRESSION: Infiltrate at the right base, usually pneumonia. Followup PA and lateral chest X-ray is recommended in 3-4 weeks following trial of antibiotic therapy to ensure resolution. Electronically Signed   By: Tiburcio Pea M.D.   On: 09/14/2023 07:15    EKG: Independently reviewed.  Sinus tachycardia with rate 111; nonspecific ST changes with no evidence of acute ischemia. PAC, no prior EKG.    Labs on Admission: I have personally reviewed the available labs and imaging studies at the time of the admission.  Pertinent labs:   covid/flu/RSV negative,  potassium: 3.2,  mag: 1.5,  arterial ABG: ph: 7.357, O2: 61,  lactic acid: 2.0  Assessment and Plan: Principal Problem:   Acute respiratory failure with hypoxia secondary to multifocal pneumonia Active Problems:   Multifocal pneumonia   Sepsis due to pneumonia vs. diverticulitis   Hypokalemia   Diverticulitis   Hypomagnesemia   Benign essential HTN   GERD (gastroesophageal reflux disease)   Hyperlipidemia    Assessment and Plan: * Acute respiratory failure with hypoxia secondary to multifocal pneumonia 82 year old female with one week history of coughing, abdominal pain, N/V/D found to be hypoxic down to the 80s on room air with multifocal pneumonia and moderate diffuse wall thickening of the sigmoid colon prior diverticulitis vs. Acute inflammation -admit to progressive -? Aspiration pneumonia with innumerable episodes of vomiting and acute hypoxia  -multifocal pneumonia, predominantly in RLL  -continue antibiotics with rocephin/azithromycin and flagyl to cover for pneumonia +diverticulitis  -BC pending  -sputum culture ordered -urinary antigens ordered -covid/flu/RSV negative -RVP and PCT ordered -feeling much better -trend LA, will give IVF if continue to trend upward. Encouraged oral intake   Sepsis due to pneumonia vs. diverticulitis Sepsis criteria with tachycarida, tachypnea secondary to  multifocal pneumonia vs. Diverticulitis.  Resolving with IVF/treatment Continue abx  Lactic acid 2.0>pending   Diverticulitis Last colonoscopy in [place] around 70-71 years. 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   GERD (gastroesophageal reflux disease) Also known hiatal hernia, Continue PPI   Hyperlipidemia States she is on crestor, continue once medrec done     Advance Care Planning:   Code Status: Full Code   Consults: none   DVT Prophylaxis: lovenox   Family Communication: none   Severity of Illness: The appropriate patient status for this patient is INPATIENT. Inpatient status is judged to be reasonable and necessary in order to provide the required intensity of service to ensure the patient's safety. The patient's presenting symptoms, physical exam findings, and initial radiographic and laboratory data in the context of their chronic comorbidities is felt to place them at high risk for further clinical deterioration. Furthermore, it is not anticipated that the patient will  be medically stable for discharge from the hospital within 2 midnights of admission.   * I certify that at the point of admission it is my clinical judgment that the patient will require inpatient hospital care spanning beyond 2 midnights from the point of admission due to high intensity of service, high risk for further deterioration and high frequency of surveillance  required.*  Author: Orland Mustard, MD 09/14/2023 5:16 PM  For on call review [email or web address].

## 2023-09-14 NOTE — Assessment & Plan Note (Addendum)
Last colonoscopy in [place] around 70-71 years. 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

## 2023-09-14 NOTE — ED Notes (Signed)
RT increased pts oxygen from [place] 5 Lpm to Simple Mask 7 Lpm d/t mouth breathing and continued decreased sats at this time. MD aware. Pt remains stable in her respiratory status at this time. RT will continue to monitor while @[facility] .

## 2023-09-14 NOTE — Assessment & Plan Note (Signed)
Repleted in ED Trend

## 2023-09-14 NOTE — Assessment & Plan Note (Signed)
Also known hiatal hernia, Continue PPI

## 2023-09-14 NOTE — Plan of Care (Signed)
  Problem: Clinical Measurements: Goal: Ability to maintain clinical measurements within normal limits will improve Outcome: Progressing   Problem: Activity: Goal: Risk for activity intolerance will decrease Outcome: Progressing   Problem: Nutrition: Goal: Adequate nutrition will be maintained Outcome: Progressing   Problem: Elimination: Goal: Will not experience complications related to bowel motility Outcome: Progressing   Problem: Pain Management: Goal: General experience of comfort will improve Outcome: Progressing

## 2023-09-14 NOTE — ED Notes (Signed)
Called [name] at [facility] for transport 13:04

## 2023-09-14 NOTE — Assessment & Plan Note (Signed)
Sepsis criteria with tachycarida, tachypnea secondary to multifocal pneumonia vs. Diverticulitis.  Resolving with IVF/treatment Continue abx  Lactic acid 2.0>pending

## 2023-09-14 NOTE — Progress Notes (Signed)
52F with diarrhea and pain meeting sepsis criteria found to have multifocal pneumonia and possibly diverticulitis. Hypoxic requiring oxygen. Got Zosyn. Accepted to progressive bed.

## 2023-09-14 NOTE — ED Notes (Signed)
Report given to [facility]. 

## 2023-09-14 NOTE — Assessment & Plan Note (Signed)
States she is on crestor, continue once medrec done

## 2023-09-14 NOTE — Progress Notes (Signed)
   09/14/23 1700  Spiritual Encounters  Type of Visit Initial  Care provided to: Pt not available  OnCall Visit No   Attempted to visit with pt who was beign seen by doctor at this time. Will return to visit later.

## 2023-09-14 NOTE — ED Notes (Signed)
RT obtained ABG on pt while on Simple Mask 7 Lpm. Any critical values were reported to EDP at this time.   Latest Reference Range & Units Most Recent  Sample type  ARTERIAL 09/14/23 07:06  pH, Arterial 7.35 - 7.45  7.357 09/14/23 07:06  pCO2 arterial 32 - 48 mmHg 39.6 09/14/23 07:06  pO2, Arterial 83 - 108 mmHg 62 (L) 09/14/23 07:06  pH, Ven 7.25 - 7.43  7.385 09/14/23 07:01  pCO2, Ven 44 - 60 mmHg 38.1 (L) 09/14/23 07:01  pO2, Ven 32 - 45 mmHg 61 (H) 09/14/23 07:01  TCO2 22 - 32 mmol/L 23 09/14/23 07:06  Acid-base deficit 0.0 - 2.0 mmol/L 3.0 (H) 09/14/23 07:06  Bicarbonate 20.0 - 28.0 mmol/L 22.1 09/14/23 07:06  O2 Saturation % 90 09/14/23 07:06  Patient temperature  99.6 F 09/14/23 07:06  Collection site  RADIAL, ALLEN'S TEST ACCEPTABLE 09/14/23 07:06     09/14/23 0706  Oxygen Therapy/Pulse Ox  O2 Device Simple Mask  O2 Therapy Oxygen  O2 Flow Rate (L/min) 7 L/min  FiO2 (%) 49 %

## 2023-09-14 NOTE — Assessment & Plan Note (Signed)
Continue tele Repleted in ED along with mag Trend

## 2023-09-14 NOTE — ED Provider Notes (Signed)
Patient was bedded in the emergency room towards the end of my shift with significantly abnormal vital signs such as tachypnea, hypoxia and tachycardia.  I did evaluate the patient got her history and started her workup however none of that had been completed at the time of shift change. Dr. Particia Nearing will see the patient and ensure the workup is appropriate, add things as necessary and further decision making. Please see her note for same.   In short she is basically here for GI symptoms.  Started with vomiting diarrhea which improved but then had bloating so took some Colace and had diarrhea again.  She had a cough here and there but she does not note any shortness of breath or other symptoms.  No fevers at home. On exam she has significant crackles on the left and the right.  She is mouth breathing which explains her hypoxia on nasal cannula so we will switch to simple mask.  She is tachycardic.  Will initiate initial septic workup to include x-ray, cultures, labs.  Will hold on antibiotics until see the x-ray because also consider possible heart failure with the same symptoms.  Will get blood gas to ensure this is her level of oxygen.  Xray looks like asp pna so will start zosyn.    Marily Memos, MD 09/14/23 [phone]

## 2023-09-14 NOTE — Assessment & Plan Note (Signed)
On amlodipine 7.5mg  daily and irbersartan 150mg  daily  Blood pressures soft-normal, will continue amlodipine 5mg  for now.

## 2023-09-14 NOTE — ED Provider Notes (Signed)
[place] EMERGENCY DEPARTMENT AT [facility] Provider Note   CSN: [id number] Arrival date & time: 09/14/23  0547     History  Chief Complaint  Patient presents with   Diarrhea   Emesis   Abdominal Pain    Brooke Osborne is a 82 y.o. female.  Pt is a 82 yo female with pmhx significant for htn.  Pt said she has had diarrhea, vomiting, and cough.  She does not feel sob.  Pt has taken imodium which made her constipated, then colace and then she had diarrhea again.  She denies any pain.         Home Medications Prior to Admission medications   Not on File      Allergies    Patient has no known allergies.    Review of Systems   Review of Systems  Respiratory:  Positive for cough.   Gastrointestinal:  Positive for diarrhea and vomiting.  All other systems reviewed and are negative.   Physical Exam Updated Vital Signs BP (!) 134/55 (BP Location: Right Arm)   Pulse (!) 101   Temp 99.6 F (37.6 C)   Resp (!) 26   Ht 5\' 4"  (1.626 m)   Wt 71.7 kg   SpO2 92%   BMI 27.13 kg/m  Physical Exam Vitals and nursing note reviewed.  Constitutional:      General: She is in acute distress.     Appearance: She is well-developed. She is ill-appearing.  HENT:     Head: Normocephalic and atraumatic.     Mouth/Throat:     Mouth: Mucous membranes are moist.     Pharynx: Oropharynx is clear.  Eyes:     Extraocular Movements: Extraocular movements intact.     Pupils: Pupils are equal, round, and reactive to light.  Cardiovascular:     Rate and Rhythm: Regular rhythm. Tachycardia present.  Pulmonary:     Effort: Tachypnea present.     Breath sounds: Rhonchi present.  Abdominal:     General: Abdomen is flat. Bowel sounds are normal.     Palpations: Abdomen is soft.     Tenderness: There is no abdominal tenderness.  Skin:    General: Skin is warm.     Capillary Refill: Capillary refill takes less than 2 seconds.  Neurological:     General: No focal deficit  present.     Mental Status: She is alert and oriented to person, place, and time.  Psychiatric:        Mood and Affect: Mood normal.        Behavior: Behavior normal.     ED Results / Procedures / Treatments   Labs (all labs ordered are listed, but only abnormal results are displayed) Labs Reviewed  COMPREHENSIVE METABOLIC PANEL - Abnormal; Notable for the following components:      Result Value   Potassium 3.2 (*)    Glucose, Bld 158 (*)    Calcium 8.0 (*)    Total Protein 6.2 (*)    All other components within normal limits  URINALYSIS, ROUTINE W REFLEX MICROSCOPIC - Abnormal; Notable for the following components:   Protein, ur 30 (*)    Bacteria, UA RARE (*)    All other components within normal limits  MAGNESIUM - Abnormal; Notable for the following components:   Magnesium 1.5 (*)    All other components within normal limits  LACTIC ACID, PLASMA - Abnormal; Notable for the following components:   Lactic Acid, Venous 2.0 (*)  All other components within normal limits  I-STAT ARTERIAL BLOOD GAS, ED - Abnormal; Notable for the following components:   pO2, Arterial 62 (*)    Acid-base deficit 3.0 (*)    Potassium 2.8 (*)    All other components within normal limits  I-STAT VENOUS BLOOD GAS, ED - Abnormal; Notable for the following components:   pCO2, Ven 38.1 (*)    pO2, Ven 61 (*)    Potassium 2.9 (*)    Calcium, Ion 1.14 (*)    All other components within normal limits  RESP PANEL BY RT-PCR (RSV, FLU A&B, COVID)  [id number]  CULTURE, BLOOD (ROUTINE X 2)  CULTURE, BLOOD (ROUTINE X 2)  LIPASE, BLOOD  CBC  BRAIN NATRIURETIC PEPTIDE  LACTIC ACID, PLASMA  TROPONIN I (HIGH SENSITIVITY)  TROPONIN I (HIGH SENSITIVITY)    EKG EKG Interpretation Date/Time:  Friday September 14 2023 06:13:55 EST Ventricular Rate:  111 PR Interval:  70 QRS Duration:  153 QT Interval:  342 QTC Calculation: 463 R Axis:   52  Text Interpretation: Sinus tachycardia Atrial premature complex  Probable left ventricular hypertrophy No old tracing to compare Confirmed by Jacalyn Lefevre [phone]) on 09/14/2023 6:58:49 AM  Radiology CT CHEST ABDOMEN PELVIS W CONTRAST Result Date: 09/14/2023 CLINICAL DATA:  Hypoxia, vomiting, generalized abdominal pain. EXAM: CT CHEST, ABDOMEN, AND PELVIS WITH CONTRAST TECHNIQUE: Multidetector CT imaging of the chest, abdomen and pelvis was performed following the standard protocol during bolus administration of intravenous contrast. RADIATION DOSE REDUCTION: This exam was performed according to the departmental dose-optimization program which includes automated exposure control, adjustment of the mA and/or kV according to patient size and/or use of iterative reconstruction technique. CONTRAST:  OMNIPAQUE IOHEXOL 300 MG/ML  SOLN COMPARISON:  Radiograph of same day. FINDINGS: CT CHEST FINDINGS Cardiovascular: Atherosclerosis of thoracic aorta is noted without aneurysm or dissection. Normal cardiac size. No pericardial effusion. Mediastinum/Nodes: 1 cm calcified right thyroid lobe nodule is noted. Esophagus is unremarkable. No adenopathy. Lungs/Pleura: No pneumothorax or pleural effusion is noted. Large airspace opacity is noted in right lower lobe consistent with pneumonia. Smaller opacity is noted in left lower lobe also concerning for pneumonia. Smaller patchy airspace opacities are noted in right upper lobe also consistent with pneumonia. Musculoskeletal: No chest wall mass or suspicious bone lesions identified. CT ABDOMEN PELVIS FINDINGS Hepatobiliary: No focal liver abnormality is seen. Status post cholecystectomy. No biliary dilatation. Pancreas: Unremarkable. No pancreatic ductal dilatation or surrounding inflammatory changes. Spleen: Normal in size without focal abnormality. Adrenals/Urinary Tract: Adrenal glands appear normal. Left renal cyst is noted for which no further follow-up is required. No hydronephrosis or renal obstruction is noted. Urinary bladder  is unremarkable. Stomach/Bowel: Stomach and appendix are unremarkable. There is no evidence of bowel obstruction or inflammation. Sigmoid diverticulosis is noted. Moderate diffuse wall thickening of sigmoid colon is noted which may represent chronic sequela of prior inflammation, but acute inflammation cannot be excluded, although no surrounding inflammatory changes are noted. Vascular/Lymphatic: Aortic atherosclerosis. No enlarged abdominal or pelvic lymph nodes. Reproductive: Status post hysterectomy. No adnexal masses. Other: No abdominal wall hernia or abnormality. No abdominopelvic ascites. Musculoskeletal: No acute or significant osseous findings. IMPRESSION: Bilateral lung opacities are noted, most prominently seen in right lower lobe, consistent with multifocal pneumonia. Sigmoid diverticulosis is noted. Moderate diffuse wall thickening of sigmoid colon is noted which may simply represent chronic sequela of prior diverticulitis, but acute inflammation cannot be excluded, although no surrounding inflammatory changes are noted at this time. 1 cm  calcified right thyroid nodule. Not clinically significant; no follow-up imaging recommended. (Ref: J Am Coll Radiol. 2015 Feb;12(2): 143-50). Aortic Atherosclerosis (ICD10-I70.0). Electronically Signed   By: Lupita Raider M.D.   On: 09/14/2023 08:51   DG Chest Portable 1 View Result Date: 09/14/2023 CLINICAL DATA:  Evaluate for hypoxia. Generalized abdominal and back pain EXAM: PORTABLE CHEST 1 VIEW COMPARISON:  None Available. FINDINGS: Asymmetric infiltrate at the right base, usually pneumonia. Normal heart size and mediastinal contours for portable technique. No convincing effusion and no pneumothorax. Artifact from EKG leads. IMPRESSION: Infiltrate at the right base, usually pneumonia. Followup PA and lateral chest X-ray is recommended in 3-4 weeks following trial of antibiotic therapy to ensure resolution. Electronically Signed   By: Tiburcio Pea M.D.    On: 09/14/2023 07:15    Procedures Procedures    Medications Ordered in ED Medications  magnesium sulfate IVPB 2 g 50 mL (2 g Intravenous New Bag/Given 09/14/23 0845)  piperacillin-tazobactam (ZOSYN) IVPB 3.375 g (0 g Intravenous Stopped 09/14/23 0805)  sodium chloride 0.9 % bolus 1,000 mL (1,000 mLs Intravenous New Bag/Given 09/14/23 0803)  acetaminophen (TYLENOL) tablet 650 mg (650 mg Oral Given 09/14/23 0801)  potassium chloride SA (KLOR-CON M) CR tablet 40 mEq (40 mEq Oral Given 09/14/23 0801)  iohexol (OMNIPAQUE) 300 MG/ML solution 100 mL (100 mLs Intravenous Contrast Given 09/14/23 1610)    ED Course/ Medical Decision Making/ A&P                                 Medical Decision Making Amount and/or Complexity of Data Reviewed Labs: ordered. Radiology: ordered.  Risk OTC drugs. Prescription drug management. Decision regarding hospitalization.   This patient presents to the ED for concern of sob, this involves an extensive number of treatment options, and is a complaint that carries with it a high risk of complications and morbidity.  The differential diagnosis includes pna, covid/rsv/flu, bronchitis, electrolyte abn   Co morbidities that complicate the patient evaluation  htn   Additional history obtained:  Additional history obtained from epic chart review External records from outside source obtained and reviewed including family   Lab Tests:  I Ordered, and personally interpreted labs.  The pertinent results include:  ABG with nl pH, pO2 62 on 7L, cbc nl, cmp nl other than k sl low at 3.2, lip nl, covid/flu/rsv neg, ua neg, mg low at 1.5, lactic elevated at 2.0   Imaging Studies ordered:  I ordered imaging studies including cxr and ct chest/abd/pelvis I independently visualized and interpreted imaging which showed  Infiltrate at the right base, usually pneumonia. Followup PA and  lateral chest X-ray is recommended in 3-4 weeks following trial of   antibiotic therapy to ensure resolution.  CT chest/abd/pelvis: Bilateral lung opacities are noted, most prominently seen in right  lower lobe, consistent with multifocal pneumonia.    Sigmoid diverticulosis is noted. Moderate diffuse wall thickening of  sigmoid colon is noted which may simply represent chronic sequela of  prior diverticulitis, but acute inflammation cannot be excluded,  although no surrounding inflammatory changes are noted at this time.    1 cm calcified right thyroid nodule. Not clinically significant; no  follow-up imaging recommended. (Ref: J Am Coll Radiol. 2015  Feb;12(2): 143-50).    Aortic Atherosclerosis (ICD10-I70.0).   I agree with the radiologist interpretation   Cardiac Monitoring:  The patient was maintained on a cardiac monitor.  I  personally viewed and interpreted the cardiac monitored which showed an underlying rhythm of: st   Medicines ordered and prescription drug management:  I ordered medication including zosyn  for sx  Reevaluation of the patient after these medicines showed that the patient improved I have reviewed the patients home medicines and have made adjustments as needed   Test Considered:  ct   Critical Interventions:  Oxygen, iv abx   Consultations Obtained:  I requested consultation with the hospitalist (Dr. Kirby Crigler),  and discussed lab and imaging findings as well as pertinent plan - he will admit   Problem List / ED Course:  Multifocal pna:  pt given iv zosyn due to possibility of aspiration.  She is requiring 7L oxygen via simple mask and looks good.   Diverticulitis:  Ct showed possibility of acute diverticulitis and pt came in with primarily GI sx.  She is on zosyn which should cover that as well. Hypokalemia and hypomagnesemia:  replaced   Reevaluation:  After the interventions noted above, I reevaluated the patient and found that they have :improved   Social Determinants of Health:  Lives at  home   Dispostion:  After consideration of the diagnostic results and the patients response to treatment, I feel that the patent would benefit from admission.  CRITICAL CARE Performed by: Jacalyn Lefevre   Total critical care time: 30 minutes  Critical care time was exclusive of separately billable procedures and treating other patients.  Critical care was necessary to treat or prevent imminent or life-threatening deterioration.  Critical care was time spent personally by me on the following activities: development of treatment plan with patient and/or surrogate as well as nursing, discussions with consultants, evaluation of patient's response to treatment, examination of patient, obtaining history from patient or surrogate, ordering and performing treatments and interventions, ordering and review of laboratory studies, ordering and review of radiographic studies, pulse oximetry and re-evaluation of patient's condition.           Final Clinical Impression(s) / ED Diagnoses Final diagnoses:  Hypokalemia  Hypomagnesemia  Acute respiratory failure with hypoxia (HCC)  Multifocal pneumonia  Diverticulitis    Rx / DC Orders ED Discharge Orders     None         Jacalyn Lefevre, MD 09/14/23 [phone]

## 2023-09-14 NOTE — ED Notes (Signed)
Pt can eat/drink per EDP [place]

## 2023-09-14 NOTE — ED Triage Notes (Signed)
Pt began having diarrhea Monday along with vomiting. Denies blood in stool. Has generalized abd pain and back pain.Denies fevers.

## 2023-09-15 DIAGNOSIS — J9601 Acute respiratory failure with hypoxia: Secondary | ICD-10-CM | POA: Diagnosis not present

## 2023-09-15 LAB — CBC
HCT: 33.6 % — ABNORMAL LOW (ref 36.0–46.0)
Hemoglobin: 10.9 g/dL — ABNORMAL LOW (ref 12.0–15.0)
MCH: 30.4 pg (ref 26.0–34.0)
MCHC: 32.4 g/dL (ref 30.0–36.0)
MCV: 93.9 fL (ref 80.0–100.0)
Platelets: 178 10*3/uL (ref 150–400)
RBC: 3.58 MIL/uL — ABNORMAL LOW (ref 3.87–5.11)
RDW: 13.3 % (ref 11.5–15.5)
WBC: 13.7 10*3/uL — ABNORMAL HIGH (ref 4.0–10.5)
nRBC: 0 % (ref 0.0–0.2)

## 2023-09-15 LAB — MAGNESIUM: Magnesium: 2.3 mg/dL (ref 1.7–2.4)

## 2023-09-15 LAB — COMPREHENSIVE METABOLIC PANEL
ALT: 24 U/L (ref 0–44)
AST: 20 U/L (ref 15–41)
Albumin: 2.6 g/dL — ABNORMAL LOW (ref 3.5–5.0)
Alkaline Phosphatase: 45 U/L (ref 38–126)
Anion gap: 6 (ref 5–15)
BUN: 10 mg/dL (ref 8–23)
CO2: 26 mmol/L (ref 22–32)
Calcium: 8 mg/dL — ABNORMAL LOW (ref 8.9–10.3)
Chloride: 108 mmol/L (ref 98–111)
Creatinine, Ser: 0.53 mg/dL (ref 0.44–1.00)
GFR, Estimated: >60 mL/min (ref >60)
Glucose, Bld: 109 mg/dL — ABNORMAL HIGH (ref 70–99)
Potassium: 3.6 mmol/L (ref 3.5–5.1)
Sodium: 140 mmol/L (ref 135–145)
Total Bilirubin: 0.6 mg/dL (ref <1.2)
Total Protein: 5.4 g/dL — ABNORMAL LOW (ref 6.5–8.1)

## 2023-09-15 LAB — PROCALCITONIN: Procalcitonin: 15.19 ng/mL

## 2023-09-15 MED ORDER — AZITHROMYCIN 250 MG PO TABS
500.0000 mg | ORAL_TABLET | Freq: Every day | ORAL | Status: DC
  Administered 2023-09-15 – 2023-09-17 (×3): 500 mg via ORAL
  Filled 2023-09-15 (×4): qty 2

## 2023-09-15 NOTE — Progress Notes (Signed)
Shift Summary: Pt was able to wean to 2L O2 via nasal cannula. Ambulated independently around the unit. Remains on ABT, no adverse reactions noted. Will continue to monitor.

## 2023-09-15 NOTE — Plan of Care (Signed)

## 2023-09-15 NOTE — Progress Notes (Signed)
RT left a sputum specimen cup @ bedside, with instructions given to pt, @ this time, pt has a non-productive cough.

## 2023-09-15 NOTE — Progress Notes (Signed)
PROGRESS NOTE    Brooke Osborne  YNW:[id number] DOB: 04-28-1941 DOA: 09/14/2023 PCP: Thana Ates, MD   Brief Narrative:  This 82 years old female with PMH significant for HTN , GERD, hiatal hernia, hyperlipidemia presented in the ED with complaints of nausea, vomiting, diarrhea and coughing.  Patient reports having cough for last 1 week, She also reports fatigue, tiredness and has developed copious amount of vomiting and diarrhea. She was brought in the ED by her friend.  Chest x-ray shows infiltrate at the right base likely pneumonia. CT abdomen and pelvis findings consistent with multifocal pneumonia, sigmoid diverticulosis, patient was admitted for further evaluation.  Assessment & Plan:   Principal Problem:   Acute respiratory failure with hypoxia secondary to multifocal pneumonia Active Problems:   Multifocal pneumonia   Sepsis due to pneumonia vs. diverticulitis   Hypokalemia   Diverticulitis   Hypomagnesemia   Benign essential HTN   GERD (gastroesophageal reflux disease)   Hyperlipidemia  Acute hypoxic respiratory failure sec. to multifocal pneumonia: Patient presented with cough for 1 week followed by abdominal pain,  nausea , vomiting and diarrhea. She was hypoxic with SpO2 of 80% on room air, requiring 4 L of supplemental oxygen Chest x-ray showed findings consistent with multifocal pneumonia. Aspiration pneumonia with innumerable episodes of vomiting and acute hypoxia could be possibility. Continue empiric antibiotics (Ceftriaxone and Zithromax and Flagyl to cover for diverticulitis) Follow-up blood and urine cultures. RSV, flu, COVID-negative. Continue supplemental oxygen and wean as tolerated.  Sepsis secondary to pneumonia / diverticulitis: Presented with criteria, tachycardia, tachypnea, lactic acidosis, hypoxia. Continue with IV fluids and IV antibiotics. Trend lactic acid.  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 CT A&P showed findings concerning for diverticulitis. Continue Rocephin and Flagyl. Continue clear liquid diet. Follow-up outpatient GI  Hypokalemia: Replaced.  Continue to monitor  Hypomagnesemia: Replaced.  Continue to monitor  Essential hypertension: Continue amlodipine 5.7 mg daily,   Irbesartan 150 mg daily  GERD: Continue PPI  Hyperlipidemia: Continue Crestor 10 mg daily   DVT prophylaxis: Lovenox Code Status: Full code Family Communication: No family at side. Disposition Plan:    Status is: Inpatient Remains inpatient appropriate because: Admitted for acute hypoxic respiratory failure secondary to pneumonia.   Consultants:  None  Procedures: None  Antimicrobials:  Anti-infectives (From admission, onward)    Start     Dose/Rate Route Frequency Ordered Stop   09/14/23 1800  cefTRIAXone (ROCEPHIN) 2 g in sodium chloride 0.9 % 100 mL IVPB        2 g 200 mL/hr over 30 Minutes Intravenous Every 24 hours 09/14/23 1656     09/14/23 1800  metroNIDAZOLE (FLAGYL) IVPB 500 mg        500 mg 100 mL/hr over 60 Minutes Intravenous Every 12 hours 09/14/23 1656     09/14/23 1800  azithromycin (ZITHROMAX) 500 mg in sodium chloride 0.9 % 250 mL IVPB        500 mg 250 mL/hr over 60 Minutes Intravenous Every 24 hours 09/14/23 1656     09/14/23 0715  piperacillin-tazobactam (ZOSYN) IVPB 3.375 g        3.375 g 100 mL/hr over 30 Minutes Intravenous  Once 09/14/23 0700 09/14/23 0805      Subjective: Patient was seen and examined at bedside. Overnight events noted. Patient reports doing better,  still reports feeling short of breath but improving.  Objective: Vitals:   09/14/23  1952 09/14/23 2305 09/15/23 0312 09/15/23 0925  BP: (!) 123/52 (!) 103/49 (!) 116/56 (!) 150/54  Pulse: 93 84 82 85  Resp: 20 16 15    Temp: 98.9 F (37.2 C) 98.5 F (36.9 C) 98.5 F (36.9 C)   TempSrc: Oral Oral Oral   SpO2: 95% 96% 96%  95%  Weight:      Height:        Intake/Output Summary (Last 24 hours) at 09/15/2023 1110 Last data filed at 09/15/2023 4098 Gross per 24 hour  Intake 664.22 ml  Output 100 ml  Net 564.22 ml   Filed Weights   09/14/23 0606  Weight: 71.7 kg    Examination:  General exam: Appears calm and comfortable, not in any acute distress Respiratory system: Clear to auscultation. Respiratory effort normal.  RR 16 Cardiovascular system: S1 & S2 heard, RRR. No JVD, murmurs, rubs, gallops or clicks.  Gastrointestinal system: Abdomen is non distended, soft and non tender.  Normal bowel sounds heard. Central nervous system: Alert and oriented x 3. No focal neurological deficits. Extremities: No edema, no cyanosis, no clubbing Skin: No rashes, lesions or ulcers Psychiatry: Judgement and insight appear normal. Mood & affect appropriate.     Data Reviewed: I have personally reviewed following labs and imaging studies  CBC: Recent Labs  Lab 09/14/23 0620 09/14/23 0701 09/14/23 0706 09/15/23 0426  WBC 7.5  --   --  13.7*  HGB 14.5 13.3 13.6 10.9*  HCT 43.4 39.0 40.0 33.6*  MCV 89.5  --   --  93.9  PLT 233  --   --  178   Basic Metabolic Panel: Recent Labs  Lab 09/14/23 0620 09/14/23 0701 09/14/23 0706 09/15/23 0426  NA 141 142 142 140  K 3.2* 2.9* 2.8* 3.6  CL 106  --   --  108  CO2 24  --   --  26  GLUCOSE 158*  --   --  109*  BUN 17  --   --  10  CREATININE 0.61  --   --  0.53  CALCIUM 8.0*  --   --  8.0*  MG 1.5*  --   --  2.3   GFR: Estimated Creatinine Clearance: 54.5 mL/min (by C-G formula based on SCr of 0.53 mg/dL). Liver Function Tests: Recent Labs  Lab 09/14/23 0620 09/15/23 0426  AST 26 20  ALT 26 24  ALKPHOS 57 45  BILITOT 0.5 0.6  PROT 6.2* 5.4*  ALBUMIN 3.5 2.6*   Recent Labs  Lab 09/14/23 0620  LIPASE 17   No results for input(s): "AMMONIA" in the last 168 hours. Coagulation Profile: No results for input(s): "INR", "PROTIME" in the last 168  hours. Cardiac Enzymes: No results for input(s): "CKTOTAL", "CKMB", "CKMBINDEX", "TROPONINI" in the last 168 hours. BNP (last 3 results) No results for input(s): "PROBNP" in the last 8760 hours. HbA1C: No results for input(s): "HGBA1C" in the last 72 hours. CBG: No results for input(s): "GLUCAP" in the last 168 hours. Lipid Profile: No results for input(s): "CHOL", "HDL", "LDLCALC", "TRIG", "CHOLHDL", "LDLDIRECT" in the last 72 hours. Thyroid Function Tests: No results for input(s): "TSH", "T4TOTAL", "FREET4", "T3FREE", "THYROIDAB" in the last 72 hours. Anemia Panel: No results for input(s): "VITAMINB12", "FOLATE", "FERRITIN", "TIBC", "IRON", "RETICCTPCT" in the last 72 hours. Sepsis Labs: Recent Labs  Lab 09/14/23 0824 09/14/23 1631 09/14/23 1927 09/15/23 0426  PROCALCITON  --  16.75  --  15.19  LATICACIDVEN 2.0*  --  1.6  --  Recent Results (from the past 240 hours)  Blood culture (routine x 2)     Status: None (Preliminary result)   Collection Time: 09/14/23  6:45 AM   Specimen: BLOOD RIGHT HAND  Result Value Ref Range Status   Specimen Description   Final    BLOOD RIGHT HAND Performed at [facility], [address] [place], [place] [address]    Special Requests   Final    BOTTLES DRAWN AEROBIC AND ANAEROBIC Blood Culture adequate volume Performed at [facility], [address] [place], [place] [address]    Culture   Final    NO GROWTH < 24 HOURS Performed at [facility], [address], [place], [place] [address]    Report Status PENDING  Incomplete  Blood culture (routine x 2)     Status: None (Preliminary result)   Collection Time: 09/14/23  6:46 AM   Specimen: BLOOD  Result Value Ref Range Status   Specimen Description   Final    BLOOD RIGHT ANTECUBITAL Performed at [facility], [address] [place], [place] [address]    Special Requests   Final    BOTTLES DRAWN AEROBIC AND ANAEROBIC Blood  Culture adequate volume Performed at [facility], [address] [place], [place] [address]    Culture   Final    NO GROWTH < 24 HOURS Performed at [facility], [address], [place], [place] [address]    Report Status PENDING  Incomplete  Resp panel by RT-PCR (RSV, Flu A&B, Covid) Anterior Nasal Swab     Status: None   Collection Time: 09/14/23  7:00 AM   Specimen: Anterior Nasal Swab  Result Value Ref Range Status   SARS Coronavirus 2 by RT PCR NEGATIVE NEGATIVE Final    Comment: (NOTE) SARS-CoV-2 target nucleic acids are NOT DETECTED.  The SARS-CoV-2 RNA is generally detectable in upper respiratory specimens during the acute phase of infection. The lowest concentration of SARS-CoV-2 viral copies this assay can detect is 138 copies/mL. A negative result does not preclude SARS-Cov-2 infection and should not be used as the sole basis for treatment or other patient management decisions. A negative result may occur with  improper specimen collection/handling, submission of specimen other than nasopharyngeal swab, presence of viral mutation(s) within the areas targeted by this assay, and inadequate number of viral copies(<138 copies/mL). A negative result must be combined with clinical observations, patient history, and epidemiological information. The expected result is Negative.  Fact Sheet for Patients:  BloggerCourse.com  Fact Sheet for Healthcare Providers:  SeriousBroker.it  This test is no t yet approved or cleared by the [place] FDA and  has been authorized for detection and/or diagnosis of SARS-CoV-2 by FDA under an Emergency Use Authorization (EUA). This EUA will remain  in effect (meaning this test can be used) for the duration of the COVID-19 declaration under Section 564(b)(1) of the Act, 21 U.S.C.section 360bbb-3(b)(1), unless the authorization is terminated  or revoked sooner.        Influenza A by PCR NEGATIVE NEGATIVE Final   Influenza B by PCR NEGATIVE NEGATIVE Final    Comment: (NOTE) The Xpert Xpress SARS-CoV-2/FLU/RSV plus assay is intended as an aid in the diagnosis of influenza from Nasopharyngeal swab specimens and should not be used as a sole basis for treatment. Nasal washings and aspirates are unacceptable for Xpert Xpress SARS-CoV-2/FLU/RSV testing.  Fact Sheet for Patients: BloggerCourse.com  Fact Sheet for Healthcare Providers:  SeriousBroker.it  This test is not yet approved or cleared by the [place] and has been authorized for detection and/or diagnosis of SARS-CoV-2 by FDA under an Emergency Use Authorization (EUA). This EUA will remain in effect (meaning this test can be used) for the duration of the COVID-19 declaration under Section 564(b)(1) of the Act, 21 U.S.C. section 360bbb-3(b)(1), unless the authorization is terminated or revoked.     Resp Syncytial Virus by PCR NEGATIVE NEGATIVE Final    Comment: (NOTE) Fact Sheet for Patients: BloggerCourse.com  Fact Sheet for Healthcare Providers: SeriousBroker.it  This test is not yet approved or cleared by the [place] FDA and has been authorized for detection and/or diagnosis of SARS-CoV-2 by FDA under an Emergency Use Authorization (EUA). This EUA will remain in effect (meaning this test can be used) for the duration of the COVID-19 declaration under Section 564(b)(1) of the Act, 21 U.S.C. section 360bbb-3(b)(1), unless the authorization is terminated or revoked.  Performed at [facility], [address], [place], [place] [address]   Respiratory (~20 pathogens) panel by PCR     Status: None   Collection Time: 09/14/23  5:04 PM   Specimen: Nasopharyngeal Swab; Respiratory  Result Value Ref Range Status   Adenovirus NOT DETECTED NOT DETECTED  Final   Coronavirus 229E NOT DETECTED NOT DETECTED Final    Comment: (NOTE) The Coronavirus on the Respiratory Panel, DOES NOT test for the novel  Coronavirus (2019 nCoV)    Coronavirus HKU1 NOT DETECTED NOT DETECTED Final   Coronavirus NL63 NOT DETECTED NOT DETECTED Final   Coronavirus OC43 NOT DETECTED NOT DETECTED Final   Metapneumovirus NOT DETECTED NOT DETECTED Final   Rhinovirus / Enterovirus NOT DETECTED NOT DETECTED Final   Influenza A NOT DETECTED NOT DETECTED Final   Influenza B NOT DETECTED NOT DETECTED Final   Parainfluenza Virus 1 NOT DETECTED NOT DETECTED Final   Parainfluenza Virus 2 NOT DETECTED NOT DETECTED Final   Parainfluenza Virus 3 NOT DETECTED NOT DETECTED Final   Parainfluenza Virus 4 NOT DETECTED NOT DETECTED Final   Respiratory Syncytial Virus NOT DETECTED NOT DETECTED Final   Bordetella pertussis NOT DETECTED NOT DETECTED Final   Bordetella Parapertussis NOT DETECTED NOT DETECTED Final   Chlamydophila pneumoniae NOT DETECTED NOT DETECTED Final   Mycoplasma pneumoniae NOT DETECTED NOT DETECTED Final    Comment: Performed at [facility], [address], [place], [place] [address]    Radiology Studies: CT CHEST ABDOMEN PELVIS W CONTRAST Result Date: 09/14/2023 CLINICAL DATA:  Hypoxia, vomiting, generalized abdominal pain. EXAM: CT CHEST, ABDOMEN, AND PELVIS WITH CONTRAST TECHNIQUE: Multidetector CT imaging of the chest, abdomen and pelvis was performed following the standard protocol during bolus administration of intravenous contrast. RADIATION DOSE REDUCTION: This exam was performed according to the departmental dose-optimization program which includes automated exposure control, adjustment of the mA and/or kV according to patient size and/or use of iterative reconstruction technique. CONTRAST:  OMNIPAQUE IOHEXOL 300 MG/ML  SOLN COMPARISON:  Radiograph of same day. FINDINGS: CT CHEST FINDINGS Cardiovascular: Atherosclerosis of thoracic aorta is noted  without aneurysm or dissection. Normal cardiac size. No pericardial effusion. Mediastinum/Nodes: 1 cm calcified right thyroid lobe nodule is noted. Esophagus is unremarkable. No adenopathy. Lungs/Pleura: No pneumothorax or pleural effusion is noted. Large airspace opacity is noted in right lower lobe consistent with pneumonia. Smaller opacity is noted in left lower lobe also concerning for pneumonia. Smaller patchy airspace opacities are noted in right upper lobe also  consistent with pneumonia. Musculoskeletal: No chest wall mass or suspicious bone lesions identified. CT ABDOMEN PELVIS FINDINGS Hepatobiliary: No focal liver abnormality is seen. Status post cholecystectomy. No biliary dilatation. Pancreas: Unremarkable. No pancreatic ductal dilatation or surrounding inflammatory changes. Spleen: Normal in size without focal abnormality. Adrenals/Urinary Tract: Adrenal glands appear normal. Left renal cyst is noted for which no further follow-up is required. No hydronephrosis or renal obstruction is noted. Urinary bladder is unremarkable. Stomach/Bowel: Stomach and appendix are unremarkable. There is no evidence of bowel obstruction or inflammation. Sigmoid diverticulosis is noted. Moderate diffuse wall thickening of sigmoid colon is noted which may represent chronic sequela of prior inflammation, but acute inflammation cannot be excluded, although no surrounding inflammatory changes are noted. Vascular/Lymphatic: Aortic atherosclerosis. No enlarged abdominal or pelvic lymph nodes. Reproductive: Status post hysterectomy. No adnexal masses. Other: No abdominal wall hernia or abnormality. No abdominopelvic ascites. Musculoskeletal: No acute or significant osseous findings. IMPRESSION: Bilateral lung opacities are noted, most prominently seen in right lower lobe, consistent with multifocal pneumonia. Sigmoid diverticulosis is noted. Moderate diffuse wall thickening of sigmoid colon is noted which may simply represent  chronic sequela of prior diverticulitis, but acute inflammation cannot be excluded, although no surrounding inflammatory changes are noted at this time. 1 cm calcified right thyroid nodule. Not clinically significant; no follow-up imaging recommended. (Ref: J Am Coll Radiol. 2015 Feb;12(2): 143-50). Aortic Atherosclerosis (ICD10-I70.0). Electronically Signed   By: Lupita Raider M.D.   On: 09/14/2023 08:51   DG Chest Portable 1 View Result Date: 09/14/2023 CLINICAL DATA:  Evaluate for hypoxia. Generalized abdominal and back pain EXAM: PORTABLE CHEST 1 VIEW COMPARISON:  None Available. FINDINGS: Asymmetric infiltrate at the right base, usually pneumonia. Normal heart size and mediastinal contours for portable technique. No convincing effusion and no pneumothorax. Artifact from EKG leads. IMPRESSION: Infiltrate at the right base, usually pneumonia. Followup PA and lateral chest X-ray is recommended in 3-4 weeks following trial of antibiotic therapy to ensure resolution. Electronically Signed   By: Tiburcio Pea M.D.   On: 09/14/2023 07:15   Scheduled Meds:  amLODipine  5 mg Oral Daily   enoxaparin (LOVENOX) injection  40 mg Subcutaneous Q24H   pantoprazole  40 mg Oral Daily   rosuvastatin  10 mg Oral QHS   Continuous Infusions:  azithromycin     cefTRIAXone (ROCEPHIN)  IV 2 g (09/14/23 1828)   metronidazole 500 mg (09/15/23 0556)     LOS: 1 day    Time spent: 50 Mins    Willeen Niece, MD [facility]   If 7PM-7AM, please contact night-coverage

## 2023-09-16 DIAGNOSIS — J9601 Acute respiratory failure with hypoxia: Secondary | ICD-10-CM | POA: Diagnosis not present

## 2023-09-16 NOTE — Progress Notes (Signed)
PROGRESS NOTE    Brooke Osborne  JXB:[id number] DOB: 02/07/1941 DOA: 09/14/2023 PCP: Thana Ates, MD   Brief Narrative:  This 82 years old female with PMH significant for HTN , GERD, hiatal hernia, hyperlipidemia presented in the ED with complaints of nausea, vomiting, diarrhea and coughing.  Patient reports having cough for last 1 week, She also reports fatigue, tiredness and has developed copious amount of vomiting and diarrhea. She was brought in the ED by her friend.  Chest x-ray shows infiltrate at the right base likely pneumonia. CT abdomen and pelvis findings consistent with multifocal pneumonia, sigmoid diverticulosis, patient was admitted for further evaluation.  Assessment & Plan:   Principal Problem:   Acute respiratory failure with hypoxia secondary to multifocal pneumonia Active Problems:   Multifocal pneumonia   Sepsis due to pneumonia vs. diverticulitis   Hypokalemia   Diverticulitis   Hypomagnesemia   Benign essential HTN   GERD (gastroesophageal reflux disease)   Hyperlipidemia  Acute hypoxic respiratory failure sec. to multifocal pneumonia: Patient presented with cough for 1 week followed by abdominal pain,  nausea , vomiting and diarrhea. She was hypoxic with SpO2 of 80% on room air, requiring 4 L of supplemental oxygen Chest x-ray showed findings consistent with multifocal pneumonia. Aspiration pneumonia with innumerable episodes of vomiting and acute hypoxia could be possibility. Continue empiric antibiotics (Ceftriaxone and Zithromax and Flagyl to cover for diverticulitis) Blood cultures no growth so far, respiratory viral panel negative. RSV, flu, COVID-negative.  Procalcitonin 15.19 Continue supplemental oxygen and wean as tolerated.  Sepsis secondary to pneumonia / diverticulitis: Presented with sepsis criteria ( tachycardia, tachypnea, lactic acidosis, hypoxia ). Continue with IV fluids and IV antibiotics. Lactic acid  normalized.  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 CT A&P showed findings concerning for diverticulitis. Continue Rocephin and Flagyl. Continue clear liquid diet. Follow-up outpatient GI  Hypokalemia: Replaced.  Resolved.   Hypomagnesemia: Replaced.  Resolved.  Essential hypertension: Continue amlodipine 5.7 mg daily,   Continue Irbesartan 150 mg daily  GERD: Continue PPI  Hyperlipidemia: Continue Crestor 10 mg daily   DVT prophylaxis: Lovenox Code Status: Full code Family Communication: No family at side. Disposition Plan:    Status is: Inpatient Remains inpatient appropriate because: Admitted for acute hypoxic respiratory failure secondary to pneumonia.   Consultants:  None  Procedures: None  Antimicrobials:  Anti-infectives (From admission, onward)    Start     Dose/Rate Route Frequency Ordered Stop   09/15/23 1600  azithromycin (ZITHROMAX) tablet 500 mg        500 mg Oral Daily 09/15/23 1500 09/20/23 0959   09/14/23 1800  cefTRIAXone (ROCEPHIN) 2 g in sodium chloride 0.9 % 100 mL IVPB        2 g 200 mL/hr over 30 Minutes Intravenous Every 24 hours 09/14/23 1656     09/14/23 1800  metroNIDAZOLE (FLAGYL) IVPB 500 mg        500 mg 100 mL/hr over 60 Minutes Intravenous Every 12 hours 09/14/23 1656     09/14/23 1800  azithromycin (ZITHROMAX) 500 mg in sodium chloride 0.9 % 250 mL IVPB  Status:  Discontinued        500 mg 250 mL/hr over 60 Minutes Intravenous Every 24 hours 09/14/23 1656 09/15/23 1500   09/14/23 0715  piperacillin-tazobactam (ZOSYN) IVPB 3.375 g        3.375 g 100 mL/hr over 30 Minutes Intravenous  Once 09/14/23  0700 09/14/23 0805      Subjective: Patient was seen and examined at bedside. Overnight events noted. Patient reports doing much improved.  She reports shortness of breath has significantly improved.  Objective: Vitals:   09/15/23  1518 09/15/23 1957 09/16/23 0451 09/16/23 0845  BP:  131/63 (!) 131/50 (!) 131/59  Pulse:  78 72 78  Resp:  20 16   Temp:  97.9 F (36.6 C) 98.7 F (37.1 C)   TempSrc:   Oral   SpO2: 95% 95% 94% 94%  Weight:      Height:        Intake/Output Summary (Last 24 hours) at 09/16/2023 1121 Last data filed at 09/15/2023 1851 Gross per 24 hour  Intake 218.97 ml  Output --  Net 218.97 ml   Filed Weights   09/14/23 0606  Weight: 71.7 kg    Examination:  General exam: Appears comfortable, not in any acute distress. Respiratory system: CTA bilaterally . Respiratory effort normal.  RR 14 Cardiovascular system: S1 & S2 heard, RRR. No JVD, murmurs, rubs, gallops or clicks.  Gastrointestinal system: Abdomen is non distended, soft and non tender.  Normal bowel sounds heard. Central nervous system: Alert and oriented x 3. No focal neurological deficits. Extremities: No edema, no cyanosis, no clubbing Skin: No rashes, lesions or ulcers Psychiatry: Judgement and insight appear normal. Mood & affect appropriate.     Data Reviewed: I have personally reviewed following labs and imaging studies  CBC: Recent Labs  Lab 09/14/23 0620 09/14/23 0701 09/14/23 0706 09/15/23 0426  WBC 7.5  --   --  13.7*  HGB 14.5 13.3 13.6 10.9*  HCT 43.4 39.0 40.0 33.6*  MCV 89.5  --   --  93.9  PLT 233  --   --  178   Basic Metabolic Panel: Recent Labs  Lab 09/14/23 0620 09/14/23 0701 09/14/23 0706 09/15/23 0426  NA 141 142 142 140  K 3.2* 2.9* 2.8* 3.6  CL 106  --   --  108  CO2 24  --   --  26  GLUCOSE 158*  --   --  109*  BUN 17  --   --  10  CREATININE 0.61  --   --  0.53  CALCIUM 8.0*  --   --  8.0*  MG 1.5*  --   --  2.3   GFR: Estimated Creatinine Clearance: 54.5 mL/min (by C-G formula based on SCr of 0.53 mg/dL). Liver Function Tests: Recent Labs  Lab 09/14/23 0620 09/15/23 0426  AST 26 20  ALT 26 24  ALKPHOS 57 45  BILITOT 0.5 0.6  PROT 6.2* 5.4*  ALBUMIN 3.5 2.6*    Recent Labs  Lab 09/14/23 0620  LIPASE 17   No results for input(s): "AMMONIA" in the last 168 hours. Coagulation Profile: No results for input(s): "INR", "PROTIME" in the last 168 hours. Cardiac Enzymes: No results for input(s): "CKTOTAL", "CKMB", "CKMBINDEX", "TROPONINI" in the last 168 hours. BNP (last 3 results) No results for input(s): "PROBNP" in the last 8760 hours. HbA1C: No results for input(s): "HGBA1C" in the last 72 hours. CBG: No results for input(s): "GLUCAP" in the last 168 hours. Lipid Profile: No results for input(s): "CHOL", "HDL", "LDLCALC", "TRIG", "CHOLHDL", "LDLDIRECT" in the last 72 hours. Thyroid Function Tests: No results for input(s): "TSH", "T4TOTAL", "FREET4", "T3FREE", "THYROIDAB" in the last 72 hours. Anemia Panel: No results for input(s): "VITAMINB12", "FOLATE", "FERRITIN", "TIBC", "IRON", "RETICCTPCT" in the last 72 hours. Sepsis Labs:  Recent Labs  Lab 09/14/23 0824 09/14/23 1631 09/14/23 1927 09/15/23 0426  PROCALCITON  --  16.75  --  15.19  LATICACIDVEN 2.0*  --  1.6  --     Recent Results (from the past 240 hours)  Blood culture (routine x 2)     Status: None (Preliminary result)   Collection Time: 09/14/23  6:45 AM   Specimen: BLOOD RIGHT HAND  Result Value Ref Range Status   Specimen Description   Final    BLOOD RIGHT HAND Performed at [facility], [address], [place], [place] [address]    Special Requests   Final    BOTTLES DRAWN AEROBIC AND ANAEROBIC Blood Culture adequate volume Performed at [facility], [address] [place], [place] [address]    Culture   Final    NO GROWTH 2 DAYS Performed at [facility], [address] [place], [place] [address]    Report Status PENDING  Incomplete  Blood culture (routine x 2)     Status: None (Preliminary result)   Collection Time: 09/14/23  6:46 AM   Specimen: BLOOD  Result Value Ref Range Status   Specimen Description   Final    BLOOD  RIGHT ANTECUBITAL Performed at [facility], [address], [place], [place] [address]    Special Requests   Final    BOTTLES DRAWN AEROBIC AND ANAEROBIC Blood Culture adequate volume Performed at [facility], [address], [place], [place] [address]    Culture   Final    NO GROWTH 2 DAYS Performed at [facility], [address] [place], [place] [address]    Report Status PENDING  Incomplete  Resp panel by RT-PCR (RSV, Flu A&B, Covid) Anterior Nasal Swab     Status: None   Collection Time: 09/14/23  7:00 AM   Specimen: Anterior Nasal Swab  Result Value Ref Range Status   SARS Coronavirus 2 by RT PCR NEGATIVE NEGATIVE Final    Comment: (NOTE) SARS-CoV-2 target nucleic acids are NOT DETECTED.  The SARS-CoV-2 RNA is generally detectable in upper respiratory specimens during the acute phase of infection. The lowest concentration of SARS-CoV-2 viral copies this assay can detect is 138 copies/mL. A negative result does not preclude SARS-Cov-2 infection and should not be used as the sole basis for treatment or other patient management decisions. A negative result may occur with  improper specimen collection/handling, submission of specimen other than nasopharyngeal swab, presence of viral mutation(s) within the areas targeted by this assay, and inadequate number of viral copies(<138 copies/mL). A negative result must be combined with clinical observations, patient history, and epidemiological information. The expected result is Negative.  Fact Sheet for Patients:  BloggerCourse.com  Fact Sheet for Healthcare Providers:  SeriousBroker.it  This test is no t yet approved or cleared by the [place] FDA and  has been authorized for detection and/or diagnosis of SARS-CoV-2 by FDA under an Emergency Use Authorization (EUA). This EUA will remain  in effect (meaning this test can be  used) for the duration of the COVID-19 declaration under Section 564(b)(1) of the Act, 21 U.S.C.section 360bbb-3(b)(1), unless the authorization is terminated  or revoked sooner.       Influenza A by PCR NEGATIVE NEGATIVE Final   Influenza B by PCR NEGATIVE NEGATIVE Final    Comment: (NOTE) The Xpert Xpress SARS-CoV-2/FLU/RSV plus assay is intended as an aid in the diagnosis of influenza from Nasopharyngeal swab specimens and  should not be used as a sole basis for treatment. Nasal washings and aspirates are unacceptable for Xpert Xpress SARS-CoV-2/FLU/RSV testing.  Fact Sheet for Patients: BloggerCourse.com  Fact Sheet for Healthcare Providers: SeriousBroker.it  This test is not yet approved or cleared by the [place] FDA and has been authorized for detection and/or diagnosis of SARS-CoV-2 by FDA under an Emergency Use Authorization (EUA). This EUA will remain in effect (meaning this test can be used) for the duration of the COVID-19 declaration under Section 564(b)(1) of the Act, 21 U.S.C. section 360bbb-3(b)(1), unless the authorization is terminated or revoked.     Resp Syncytial Virus by PCR NEGATIVE NEGATIVE Final    Comment: (NOTE) Fact Sheet for Patients: BloggerCourse.com  Fact Sheet for Healthcare Providers: SeriousBroker.it  This test is not yet approved or cleared by the [place] FDA and has been authorized for detection and/or diagnosis of SARS-CoV-2 by FDA under an Emergency Use Authorization (EUA). This EUA will remain in effect (meaning this test can be used) for the duration of the COVID-19 declaration under Section 564(b)(1) of the Act, 21 U.S.C. section 360bbb-3(b)(1), unless the authorization is terminated or revoked.  Performed at [facility], [address] [place], [place] [address]   Respiratory (~20 pathogens) panel  by PCR     Status: None   Collection Time: 09/14/23  5:04 PM   Specimen: Nasopharyngeal Swab; Respiratory  Result Value Ref Range Status   Adenovirus NOT DETECTED NOT DETECTED Final   Coronavirus 229E NOT DETECTED NOT DETECTED Final    Comment: (NOTE) The Coronavirus on the Respiratory Panel, DOES NOT test for the novel  Coronavirus (2019 nCoV)    Coronavirus HKU1 NOT DETECTED NOT DETECTED Final   Coronavirus NL63 NOT DETECTED NOT DETECTED Final   Coronavirus OC43 NOT DETECTED NOT DETECTED Final   Metapneumovirus NOT DETECTED NOT DETECTED Final   Rhinovirus / Enterovirus NOT DETECTED NOT DETECTED Final   Influenza A NOT DETECTED NOT DETECTED Final   Influenza B NOT DETECTED NOT DETECTED Final   Parainfluenza Virus 1 NOT DETECTED NOT DETECTED Final   Parainfluenza Virus 2 NOT DETECTED NOT DETECTED Final   Parainfluenza Virus 3 NOT DETECTED NOT DETECTED Final   Parainfluenza Virus 4 NOT DETECTED NOT DETECTED Final   Respiratory Syncytial Virus NOT DETECTED NOT DETECTED Final   Bordetella pertussis NOT DETECTED NOT DETECTED Final   Bordetella Parapertussis NOT DETECTED NOT DETECTED Final   Chlamydophila pneumoniae NOT DETECTED NOT DETECTED Final   Mycoplasma pneumoniae NOT DETECTED NOT DETECTED Final    Comment: Performed at [facility], [address], [place], [place] [address]    Radiology Studies: No results found.  Scheduled Meds:  amLODipine  5 mg Oral Daily   azithromycin  500 mg Oral Daily   enoxaparin (LOVENOX) injection  40 mg Subcutaneous Q24H   pantoprazole  40 mg Oral Daily   rosuvastatin  10 mg Oral QHS   Continuous Infusions:  cefTRIAXone (ROCEPHIN)  IV 2 g (09/15/23 1711)   metronidazole 500 mg (09/16/23 0509)     LOS: 2 days    Time spent: 35 Mins    Willeen Niece, MD [facility]   If 7PM-7AM, please contact night-coverage

## 2023-09-17 DIAGNOSIS — J9601 Acute respiratory failure with hypoxia: Secondary | ICD-10-CM | POA: Diagnosis not present

## 2023-09-17 LAB — CBC
HCT: 35.1 % — ABNORMAL LOW (ref 36.0–46.0)
Hemoglobin: 11.3 g/dL — ABNORMAL LOW (ref 12.0–15.0)
MCH: 29.8 pg (ref 26.0–34.0)
MCHC: 32.2 g/dL (ref 30.0–36.0)
MCV: 92.6 fL (ref 80.0–100.0)
Platelets: 214 10*3/uL (ref 150–400)
RBC: 3.79 MIL/uL — ABNORMAL LOW (ref 3.87–5.11)
RDW: 12.7 % (ref 11.5–15.5)
WBC: 7.6 10*3/uL (ref 4.0–10.5)
nRBC: 0 % (ref 0.0–0.2)

## 2023-09-17 LAB — BASIC METABOLIC PANEL
Anion gap: 10 (ref 5–15)
BUN: 14 mg/dL (ref 8–23)
CO2: 26 mmol/L (ref 22–32)
Calcium: 8.3 mg/dL — ABNORMAL LOW (ref 8.9–10.3)
Chloride: 104 mmol/L (ref 98–111)
Creatinine, Ser: 0.62 mg/dL (ref 0.44–1.00)
GFR, Estimated: >60 mL/min (ref >60)
Glucose, Bld: 119 mg/dL — ABNORMAL HIGH (ref 70–99)
Potassium: 3.6 mmol/L (ref 3.5–5.1)
Sodium: 140 mmol/L (ref 135–145)

## 2023-09-17 LAB — LEGIONELLA PNEUMOPHILA SEROGP 1 UR AG: L. pneumophila Serogp 1 Ur Ag: NEGATIVE

## 2023-09-17 MED ORDER — METRONIDAZOLE 500 MG PO TABS: 500.0000 mg | ORAL_TABLET | Freq: Two times a day (BID) | ORAL | 0 refills | Status: AC

## 2023-09-17 MED ORDER — CEFUROXIME AXETIL 500 MG PO TABS: 500.0000 mg | ORAL_TABLET | Freq: Two times a day (BID) | ORAL | 0 refills | Status: AC
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
## 2023-09-19 LAB — CULTURE, BLOOD (ROUTINE X 2)
Culture: NO GROWTH
Culture: NO GROWTH
Special Requests: ADEQUATE
Special Requests: ADEQUATE

## 2023-10-18 DIAGNOSIS — Z961 Presence of intraocular lens: Secondary | ICD-10-CM | POA: Diagnosis not present

## 2023-10-18 DIAGNOSIS — H52203 Unspecified astigmatism, bilateral: Secondary | ICD-10-CM | POA: Diagnosis not present

## 2023-10-31 DIAGNOSIS — I10 Essential (primary) hypertension: Secondary | ICD-10-CM | POA: Diagnosis not present

## 2023-10-31 DIAGNOSIS — Z8719 Personal history of other diseases of the digestive system: Secondary | ICD-10-CM | POA: Diagnosis not present

## 2023-10-31 DIAGNOSIS — R7303 Prediabetes: Secondary | ICD-10-CM | POA: Diagnosis not present

## 2023-10-31 DIAGNOSIS — K219 Gastro-esophageal reflux disease without esophagitis: Secondary | ICD-10-CM | POA: Diagnosis not present

## 2023-11-16 DIAGNOSIS — Z8719 Personal history of other diseases of the digestive system: Secondary | ICD-10-CM | POA: Diagnosis not present

## 2023-11-23 ENCOUNTER — Ambulatory Visit
Admission: RE | Admit: 2023-11-23 | Discharge: 2023-11-23 | Disposition: A | Discharge status: Home / Self Care | Payer: Medicare Other | Source: Ambulatory Visit | Attending: Internal Medicine | Admitting: Internal Medicine

## 2023-11-23 DIAGNOSIS — Z1231 Encounter for screening mammogram for malignant neoplasm of breast: Secondary | ICD-10-CM

## 2023-12-03 ENCOUNTER — Other Ambulatory Visit: Payer: Self-pay | Admitting: Student

## 2023-12-03 DIAGNOSIS — R195 Other fecal abnormalities: Secondary | ICD-10-CM | POA: Diagnosis not present

## 2023-12-03 DIAGNOSIS — Z8719 Personal history of other diseases of the digestive system: Secondary | ICD-10-CM

## 2023-12-10 DIAGNOSIS — R195 Other fecal abnormalities: Secondary | ICD-10-CM | POA: Diagnosis not present

## 2023-12-19 ENCOUNTER — Ambulatory Visit
Admission: RE | Admit: 2023-12-19 | Discharge: 2023-12-19 | Disposition: A | Discharge status: Home / Self Care | Source: Ambulatory Visit | Attending: Student | Admitting: Student

## 2023-12-19 DIAGNOSIS — Z8719 Personal history of other diseases of the digestive system: Secondary | ICD-10-CM

## 2023-12-19 DIAGNOSIS — R109 Unspecified abdominal pain: Secondary | ICD-10-CM | POA: Diagnosis not present

## 2023-12-19 DIAGNOSIS — K5730 Diverticulosis of large intestine without perforation or abscess without bleeding: Secondary | ICD-10-CM | POA: Diagnosis not present

## 2023-12-19 DIAGNOSIS — I700 Atherosclerosis of aorta: Secondary | ICD-10-CM | POA: Diagnosis not present

## 2023-12-19 MED ORDER — IOPAMIDOL (ISOVUE-300) INJECTION 61%
100.0000 mL | Freq: Once | INTRAVENOUS | Status: AC | PRN
  Administered 2023-12-19: 100 mL via INTRAVENOUS

## 2024-01-02 ENCOUNTER — Ambulatory Visit
Admission: RE | Admit: 2024-01-02 | Discharge: 2024-01-02 | Disposition: A | Discharge status: Home / Self Care | Payer: Medicare Other | Source: Ambulatory Visit | Attending: Internal Medicine | Admitting: Internal Medicine

## 2024-01-02 DIAGNOSIS — M810 Age-related osteoporosis without current pathological fracture: Secondary | ICD-10-CM | POA: Diagnosis not present

## 2024-01-02 DIAGNOSIS — M85852 Other specified disorders of bone density and structure, left thigh: Secondary | ICD-10-CM

## 2024-02-04 DIAGNOSIS — K5900 Constipation, unspecified: Secondary | ICD-10-CM | POA: Diagnosis not present

## 2024-02-17 DIAGNOSIS — N390 Urinary tract infection, site not specified: Secondary | ICD-10-CM | POA: Diagnosis not present

## 2024-02-23 DIAGNOSIS — I10 Essential (primary) hypertension: Secondary | ICD-10-CM | POA: Diagnosis not present

## 2024-02-23 DIAGNOSIS — E7800 Pure hypercholesterolemia, unspecified: Secondary | ICD-10-CM | POA: Diagnosis not present

## 2024-03-11 DIAGNOSIS — M9903 Segmental and somatic dysfunction of lumbar region: Secondary | ICD-10-CM | POA: Diagnosis not present

## 2024-03-11 DIAGNOSIS — M5136 Other intervertebral disc degeneration, lumbar region with discogenic back pain only: Secondary | ICD-10-CM | POA: Diagnosis not present

## 2024-03-11 DIAGNOSIS — M9904 Segmental and somatic dysfunction of sacral region: Secondary | ICD-10-CM | POA: Diagnosis not present

## 2024-03-11 DIAGNOSIS — M9905 Segmental and somatic dysfunction of pelvic region: Secondary | ICD-10-CM | POA: Diagnosis not present

## 2024-03-12 DIAGNOSIS — M9903 Segmental and somatic dysfunction of lumbar region: Secondary | ICD-10-CM | POA: Diagnosis not present

## 2024-03-12 DIAGNOSIS — M9905 Segmental and somatic dysfunction of pelvic region: Secondary | ICD-10-CM | POA: Diagnosis not present

## 2024-03-12 DIAGNOSIS — M0000 Staphylococcal arthritis, unspecified joint: Secondary | ICD-10-CM | POA: Diagnosis not present

## 2024-03-13 DIAGNOSIS — M9903 Segmental and somatic dysfunction of lumbar region: Secondary | ICD-10-CM | POA: Diagnosis not present

## 2024-03-13 DIAGNOSIS — M9905 Segmental and somatic dysfunction of pelvic region: Secondary | ICD-10-CM | POA: Diagnosis not present

## 2024-03-13 DIAGNOSIS — M0000 Staphylococcal arthritis, unspecified joint: Secondary | ICD-10-CM | POA: Diagnosis not present

## 2024-03-17 DIAGNOSIS — M9905 Segmental and somatic dysfunction of pelvic region: Secondary | ICD-10-CM | POA: Diagnosis not present

## 2024-03-17 DIAGNOSIS — M0000 Staphylococcal arthritis, unspecified joint: Secondary | ICD-10-CM | POA: Diagnosis not present

## 2024-03-17 DIAGNOSIS — M9903 Segmental and somatic dysfunction of lumbar region: Secondary | ICD-10-CM | POA: Diagnosis not present

## 2024-05-01 DIAGNOSIS — M545 Low back pain, unspecified: Secondary | ICD-10-CM | POA: Diagnosis not present

## 2024-05-01 DIAGNOSIS — I10 Essential (primary) hypertension: Secondary | ICD-10-CM | POA: Diagnosis not present

## 2024-05-01 DIAGNOSIS — R7303 Prediabetes: Secondary | ICD-10-CM | POA: Diagnosis not present

## 2024-05-01 DIAGNOSIS — M85852 Other specified disorders of bone density and structure, left thigh: Secondary | ICD-10-CM | POA: Diagnosis not present

## 2024-05-01 DIAGNOSIS — F5101 Primary insomnia: Secondary | ICD-10-CM | POA: Diagnosis not present

## 2024-05-01 DIAGNOSIS — K909 Intestinal malabsorption, unspecified: Secondary | ICD-10-CM | POA: Diagnosis not present

## 2024-05-01 DIAGNOSIS — E7800 Pure hypercholesterolemia, unspecified: Secondary | ICD-10-CM | POA: Diagnosis not present

## 2024-05-01 DIAGNOSIS — K219 Gastro-esophageal reflux disease without esophagitis: Secondary | ICD-10-CM | POA: Diagnosis not present

## 2024-05-01 DIAGNOSIS — Z0000 Encounter for general adult medical examination without abnormal findings: Secondary | ICD-10-CM | POA: Diagnosis not present

## 2024-06-07 DIAGNOSIS — N3001 Acute cystitis with hematuria: Secondary | ICD-10-CM | POA: Diagnosis not present

## 2024-06-07 DIAGNOSIS — R350 Frequency of micturition: Secondary | ICD-10-CM | POA: Diagnosis not present

## 2024-07-24 DIAGNOSIS — K5900 Constipation, unspecified: Secondary | ICD-10-CM | POA: Diagnosis not present

## 2024-08-04 ENCOUNTER — Other Ambulatory Visit: Payer: Self-pay | Admitting: Internal Medicine

## 2024-08-04 DIAGNOSIS — Z1231 Encounter for screening mammogram for malignant neoplasm of breast: Secondary | ICD-10-CM

## 2024-11-24 ENCOUNTER — Ambulatory Visit
